# Patient Record
Sex: Female | Born: 1950 | Race: Asian | Hispanic: No | Marital: Married | Smoking: Current every day smoker
Health system: Southern US, Community
[De-identification: ages and names within clinical notes are randomized; demographics above are authoritative.]

## PROBLEM LIST (undated history)

## (undated) DIAGNOSIS — I10 Essential (primary) hypertension: Secondary | ICD-10-CM

## (undated) DIAGNOSIS — K760 Fatty (change of) liver, not elsewhere classified: Secondary | ICD-10-CM

## (undated) DIAGNOSIS — T8859XA Other complications of anesthesia, initial encounter: Secondary | ICD-10-CM

## (undated) DIAGNOSIS — E119 Type 2 diabetes mellitus without complications: Secondary | ICD-10-CM

## (undated) DIAGNOSIS — M1990 Unspecified osteoarthritis, unspecified site: Secondary | ICD-10-CM

## (undated) DIAGNOSIS — T4145XA Adverse effect of unspecified anesthetic, initial encounter: Secondary | ICD-10-CM

## (undated) DIAGNOSIS — K759 Inflammatory liver disease, unspecified: Secondary | ICD-10-CM

## (undated) HISTORY — PX: FRACTURE SURGERY: SHX138

## (undated) HISTORY — PX: BACK SURGERY: SHX140

---
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
## 1973-08-30 HISTORY — PX: SPINAL FUSION: SHX223

## 1988-08-30 DIAGNOSIS — K759 Inflammatory liver disease, unspecified: Secondary | ICD-10-CM

## 1988-08-30 HISTORY — DX: Inflammatory liver disease, unspecified: K75.9

## 1990-08-30 HISTORY — PX: SPINAL FUSION: SHX223

## 1991-08-31 HISTORY — PX: ABDOMINAL HYSTERECTOMY: SHX81

## 2008-08-30 HISTORY — PX: ORIF TIBIA & FIBULA FRACTURES: SHX2131

## 2011-05-14 ENCOUNTER — Telehealth: Payer: Self-pay | Admitting: Family

## 2011-05-14 NOTE — Telephone Encounter (Signed)
Opened in error

## 2014-02-15 DIAGNOSIS — R740 Nonspecific elevation of levels of transaminase and lactic acid dehydrogenase [LDH]: Secondary | ICD-10-CM

## 2014-02-15 DIAGNOSIS — IMO0002 Reserved for concepts with insufficient information to code with codable children: Secondary | ICD-10-CM | POA: Insufficient documentation

## 2014-02-15 DIAGNOSIS — IMO0001 Reserved for inherently not codable concepts without codable children: Secondary | ICD-10-CM | POA: Insufficient documentation

## 2014-02-15 DIAGNOSIS — L720 Epidermal cyst: Secondary | ICD-10-CM | POA: Insufficient documentation

## 2014-02-15 DIAGNOSIS — M25559 Pain in unspecified hip: Secondary | ICD-10-CM | POA: Insufficient documentation

## 2014-02-15 DIAGNOSIS — K219 Gastro-esophageal reflux disease without esophagitis: Secondary | ICD-10-CM | POA: Insufficient documentation

## 2014-02-15 DIAGNOSIS — L209 Atopic dermatitis, unspecified: Secondary | ICD-10-CM | POA: Insufficient documentation

## 2014-02-15 DIAGNOSIS — M7711 Lateral epicondylitis, right elbow: Secondary | ICD-10-CM | POA: Insufficient documentation

## 2014-02-15 DIAGNOSIS — R229 Localized swelling, mass and lump, unspecified: Secondary | ICD-10-CM | POA: Insufficient documentation

## 2014-02-15 DIAGNOSIS — B079 Viral wart, unspecified: Secondary | ICD-10-CM | POA: Insufficient documentation

## 2014-02-15 DIAGNOSIS — R12 Heartburn: Secondary | ICD-10-CM | POA: Insufficient documentation

## 2014-02-15 DIAGNOSIS — I10 Essential (primary) hypertension: Secondary | ICD-10-CM | POA: Insufficient documentation

## 2014-02-15 DIAGNOSIS — D72829 Elevated white blood cell count, unspecified: Secondary | ICD-10-CM | POA: Insufficient documentation

## 2014-02-15 DIAGNOSIS — E785 Hyperlipidemia, unspecified: Secondary | ICD-10-CM | POA: Insufficient documentation

## 2014-02-15 DIAGNOSIS — M8580 Other specified disorders of bone density and structure, unspecified site: Secondary | ICD-10-CM | POA: Insufficient documentation

## 2014-02-15 DIAGNOSIS — Z9889 Other specified postprocedural states: Secondary | ICD-10-CM | POA: Insufficient documentation

## 2014-02-15 DIAGNOSIS — G43909 Migraine, unspecified, not intractable, without status migrainosus: Secondary | ICD-10-CM | POA: Insufficient documentation

## 2014-02-15 DIAGNOSIS — B182 Chronic viral hepatitis C: Secondary | ICD-10-CM | POA: Insufficient documentation

## 2014-02-15 DIAGNOSIS — E1165 Type 2 diabetes mellitus with hyperglycemia: Secondary | ICD-10-CM

## 2014-08-05 DIAGNOSIS — D123 Benign neoplasm of transverse colon: Secondary | ICD-10-CM

## 2015-06-11 DIAGNOSIS — R6882 Decreased libido: Secondary | ICD-10-CM | POA: Insufficient documentation

## 2016-02-10 DIAGNOSIS — M79674 Pain in right toe(s): Secondary | ICD-10-CM | POA: Insufficient documentation

## 2016-02-20 DIAGNOSIS — K810 Acute cholecystitis: Secondary | ICD-10-CM | POA: Insufficient documentation

## 2016-02-22 DIAGNOSIS — E876 Hypokalemia: Secondary | ICD-10-CM | POA: Insufficient documentation

## 2016-07-27 DIAGNOSIS — L659 Nonscarring hair loss, unspecified: Secondary | ICD-10-CM | POA: Insufficient documentation

## 2016-08-30 HISTORY — PX: CHOLECYSTECTOMY: SHX55

## 2017-05-31 DIAGNOSIS — M79671 Pain in right foot: Secondary | ICD-10-CM | POA: Insufficient documentation

## 2017-05-31 DIAGNOSIS — M79672 Pain in left foot: Secondary | ICD-10-CM

## 2017-09-12 DIAGNOSIS — M79641 Pain in right hand: Secondary | ICD-10-CM | POA: Insufficient documentation

## 2017-09-12 DIAGNOSIS — M79642 Pain in left hand: Secondary | ICD-10-CM

## 2017-10-04 DIAGNOSIS — M47816 Spondylosis without myelopathy or radiculopathy, lumbar region: Secondary | ICD-10-CM | POA: Insufficient documentation

## 2017-10-04 DIAGNOSIS — M5136 Other intervertebral disc degeneration, lumbar region: Secondary | ICD-10-CM | POA: Insufficient documentation

## 2017-12-29 DIAGNOSIS — S3210XA Unspecified fracture of sacrum, initial encounter for closed fracture: Secondary | ICD-10-CM | POA: Insufficient documentation

## 2018-01-17 ENCOUNTER — Other Ambulatory Visit: Payer: Self-pay | Admitting: Neurosurgery

## 2018-01-17 DIAGNOSIS — S32131A Minimally displaced Zone III fracture of sacrum, initial encounter for closed fracture: Secondary | ICD-10-CM

## 2018-01-27 ENCOUNTER — Inpatient Hospital Stay
Admission: RE | Admit: 2018-01-27 | Discharge: 2018-01-27 | Disposition: A | Discharge status: Home / Self Care | Payer: Self-pay | Source: Ambulatory Visit | Attending: Neurosurgery | Admitting: Neurosurgery

## 2018-02-03 ENCOUNTER — Ambulatory Visit
Admission: RE | Admit: 2018-02-03 | Discharge: 2018-02-03 | Disposition: A | Discharge status: Home / Self Care | Payer: Medicare Other | Source: Ambulatory Visit | Attending: Neurosurgery | Admitting: Neurosurgery

## 2018-02-03 DIAGNOSIS — S32131A Minimally displaced Zone III fracture of sacrum, initial encounter for closed fracture: Secondary | ICD-10-CM

## 2018-02-03 NOTE — Consult Note (Signed)
Chief Complaint: Patient was seen in consultation today for bilateral sacral insufficiency fractures at the request of Wall,Angel  Referring Physician(s): Wall,Angel    History of Present Illness: Angel Wall is a 67 y.o. female who sustained a fall 2 months ago 12/07/17.  She was seen by Dr. Saintclair Wall and diagnosed with bilateral sacral fractures.  This was confirmed with MRI 5/17 which demonstrated non healed bilateral sacral insufficiency fractures.  She has had continuous pain since the fall.  She currently rates her pain 8/10 on a visual analog scale while taking celebrex and neurontin.  She has been walking with a cane but with pain.  She has not had any additional falls.  The pain is in her posterior pelvis but has radicular components, worse on the right.    Her pain is greatest when getting out of bed in the morning.  She is limited in activities of daily living in that she requires much more time to care for her house.  She is unable to lift anything over five pounds.  She has extreme pain getting up from a low position.  She is unable to vacuum or empty the dishwasher without significant pain.  She scored 12/24 on the [employer] disability Questionnaire.  No past medical history on file.    Allergies: Aspirin   Medications: Prior to Admission medications   Metformin 500 mg twice daily Atenolol/chlorthalidone 25mg  once daily Celebrex 200 mg once daily Proglitazone 30 mg once daily Simvistatin 20 mig once daily Pantoprazole 40 mg once daily. Gabapentin 600 mg three times daily Cyclobenzapine 10 mg as needed     No family history on file.  Social History   Socioeconomic History  . Marital status: Married    Spouse name: Not on file  . Number of children: Not on file  . Years of education: Not on file  . Highest education level: Not on file  Occupational History  . Not on file  Social Needs  . Financial resource strain: Not on file  . Food insecurity:      Worry: Not on file    Inability: Not on file  . Transportation needs:    Medical: Not on file    Non-medical: Not on file  Tobacco Use  . Smoking status: Not on file  Substance and Sexual Activity  . Alcohol use: Not on file  . Drug use: Not on file  . Sexual activity: Not on file  Lifestyle  . Physical activity:    Days per week: Not on file    Minutes per session: Not on file  . Stress: Not on file  Relationships  . Social connections:    Talks on phone: Not on file    Gets together: Not on file    Attends religious service: Not on file    Active member of club or organization: Not on file    Attends meetings of clubs or organizations: Not on file    Relationship status: Not on file  Other Topics Concern  . Not on file  Social History Narrative  . Not on file     Review of Systems: A 12 point ROS discussed and pertinent positives are indicated in the HPI above.  All other systems are negative.  Review of Systems  Constitutional: Negative.   HENT: Negative.   Gastrointestinal: Positive for nausea.  Neurological: Positive for light-headedness.  All other systems reviewed and are negative.   Vital Signs: BP 123/75   Pulse 65  Temp 97.6 F (36.4 C)   Resp 16   Physical Exam  Constitutional: She appears well-developed and well-nourished.  Musculoskeletal:       Right shoulder: She exhibits tenderness and bony tenderness.       Lumbar back: She exhibits bony tenderness.       Back:  Pain with palpation over posterior sacrum, right greater than left.  Neurological: She has normal strength. A sensory deficit is present.  Decreased sensation to light touch lateral left calf.    Imaging: MRI Pelvis 01/13/18 - Bilateral sacral ala fractures. Labs:  CBC: 01/19/18:  7.9,/4.77/ 15.4/45.9/280  COAGS: No results for input(s): INR, APTT in the last 8760 hours.  BMP: No results for input(s): NA, K, CL, CO2, GLUCOSE, BUN, CALCIUM, CREATININE, GFRNONAA, GFRAA  in the last 8760 hours.  Invalid input(s): CMP  LIVER FUNCTION TESTS: No results for input(s): BILITOT, AST, ALT, ALKPHOS, PROT, ALBUMIN in the last 8760 hours. 2 TUMOR MARKERS: No results for input(s): AFPTM, CEA, CA199, CHROMGRNA in the last 8760 hours.  Assessment and Plan:  Bilateral sacral insufficiency fractures, more extensive right than left.  The fractures are not healed after 2 months and continue to have significant impact on her activities of daily living putting her at risk for secondary complications.  She Is walking with a cane and limited in her ability to take care of her house.  Given the delayed healing, I have recommended bilateral sacroplasty to facilitate her healing.  We discussed the risks and benefits including but not limited to infection, bleeding, cement extrusion from the sacrum, nerve injury, and failure to alleviate her pain.  We discussed the likelihood of reducing the time to complete healing of these fractures based on the vertebroplasty and sacroplasty experience as being high in the setting of otherwise delayed healing.  She is interested in proceeding and has been scheduled for 02/17/18.  Thank you for this interesting consult.  I greatly enjoyed meeting Angel Wall and look forward to participating in their care.  A copy of this report was sent to the requesting provider on this date.  Electronically Signed: Arna Snipe, MD 02/03/2018, 4:18 PM   I spent a total of  15 Minutes   in face to face in clinical consultation, greater than 50% of which was counseling/coordinating care for Angel Wall.

## 2018-02-17 ENCOUNTER — Telehealth: Payer: Self-pay

## 2018-02-17 ENCOUNTER — Ambulatory Visit
Admission: RE | Admit: 2018-02-17 | Discharge: 2018-02-17 | Disposition: A | Discharge status: Home / Self Care | Payer: Medicare Other | Source: Ambulatory Visit | Attending: Neurosurgery | Admitting: Neurosurgery

## 2018-02-17 DIAGNOSIS — S32131A Minimally displaced Zone III fracture of sacrum, initial encounter for closed fracture: Secondary | ICD-10-CM

## 2018-02-17 MED ORDER — CEFAZOLIN SODIUM-DEXTROSE 2-4 GM/100ML-% IV SOLN
2.0000 g | INTRAVENOUS | Status: AC
  Administered 2018-02-17: 2 g via INTRAVENOUS

## 2018-02-17 MED ORDER — KETOROLAC TROMETHAMINE 30 MG/ML IJ SOLN: 30.0000 mg | Freq: Once | INTRAMUSCULAR | Status: DC

## 2018-02-17 MED ORDER — FENTANYL CITRATE (PF) 100 MCG/2ML IJ SOLN: 25.0000 ug | INTRAMUSCULAR | Status: DC | PRN

## 2018-02-17 MED ORDER — MIDAZOLAM HCL 2 MG/2ML IJ SOLN: 1.0000 mg | INTRAMUSCULAR | Status: DC | PRN

## 2018-02-17 MED ORDER — SODIUM CHLORIDE 0.9 % IV SOLN
Freq: Once | INTRAVENOUS | Status: AC
  Administered 2018-02-17: 08:00:00 via INTRAVENOUS

## 2018-02-17 NOTE — Discharge Instructions (Signed)
Sacroplasty Post Procedure Discharge Instructions ° °1. May resume a regular diet and any medications that you routinely take (including pain medications). °2. No driving day of procedure. °3. Upon discharge go home and rest for at least 4 hours.  May use an ice pack as needed to injection sites on back.  Ice to back 30 minutes on and 30 minutes off, all day. °4. May remove bandaids tomorrow after taking a shower. Replace daily with clean bandaid until healed. °5. Do not lift anything heavier than a milk jug. °6. Follow up with your attending physician in 2 weeks. ° ° ° °Please contact our office at [phone] for the following symptoms: ° °· Fever greater than 100 degrees °· Increased swelling, pain, or redness at injection site. ° ° °Thank you for visiting [facility]. °

## 2018-02-22 ENCOUNTER — Other Ambulatory Visit (HOSPITAL_COMMUNITY): Payer: Self-pay | Admitting: Radiology

## 2018-02-22 ENCOUNTER — Telehealth: Payer: Self-pay

## 2018-02-22 DIAGNOSIS — Z09 Encounter for follow-up examination after completed treatment for conditions other than malignant neoplasm: Secondary | ICD-10-CM

## 2018-02-22 NOTE — Telephone Encounter (Signed)
Called patient to see how she is doing after sacroplasty here 02/17/18 and to schedule F/U appt with Dr. Jobe Igo.  She states she feels better but still has some discomfort sitting.  She understands the fractures still are healing and that this is to be expected.  Brita Romp, RN

## 2018-03-13 ENCOUNTER — Ambulatory Visit
Admission: RE | Admit: 2018-03-13 | Discharge: 2018-03-13 | Disposition: A | Discharge status: Home / Self Care | Payer: Medicare Other | Source: Ambulatory Visit | Attending: Radiology | Admitting: Radiology

## 2018-03-13 DIAGNOSIS — Z09 Encounter for follow-up examination after completed treatment for conditions other than malignant neoplasm: Secondary | ICD-10-CM

## 2018-03-13 NOTE — Progress Notes (Signed)
Referring Physician(s): Kary Kos  Chief Complaint: The patient is seen in follow up today s/p bilateral sacroplasty 02/17/18.   History of present illness:  Bilateral sacral insufficiency fractures with severe debilitating pain.  Following bilateral sacroplasty she has been more mobile and able to perform activities of daily living independently.  She reports being pain free for at least 3 days.    Subsequently she has developed a left sided radicular pain.  She now scores 6/24 on the [employer] disability questionnaire.  She reports 7/10 pain on a visual analog scale, but relates this to her radicular pain.  No past medical history on file.  Allergies: Aspirin  Medications: Prior to Admission medications   Medication Sig Start Date End Date Taking? Authorizing Provider  atenolol-chlorthalidone (TENORETIC) 100-25 MG tablet Take by mouth. 12/30/16   [provider]  celecoxib (CELEBREX) 200 MG capsule Take by mouth. 06/20/17 06/20/18  [provider]  cyclobenzaprine (FLEXERIL) 10 MG tablet Take by mouth. 10/27/17   [provider]  gabapentin (NEURONTIN) 600 MG tablet Take by mouth. 02/14/18 05/15/18  [provider]  Lancets MISC Check blood glucose twice daily / DX: E11.65 11/28/14   [provider]  metFORMIN (GLUCOPHAGE-XR) 500 MG 24 hr tablet Take 2 tablets 2 times daily 12/31/16   [provider]  pantoprazole (PROTONIX) 40 MG tablet Take by mouth. 02/14/18   [provider]  pioglitazone (ACTOS) 30 MG tablet Take by mouth. 12/31/16 09/12/18  [provider]  simvastatin (ZOCOR) 10 MG tablet Take by mouth. 12/31/16   [provider]     No family history on file.  Social History   Socioeconomic History  . Marital status: Married    Spouse name: Not on file  . Number of children: Not on file  . Years of education: Not on file  . Highest education level: Not on file  Occupational History  .  Not on file  Social Needs  . Financial resource strain: Not on file  . Food insecurity:    Worry: Not on file    Inability: Not on file  . Transportation needs:    Medical: Not on file    Non-medical: Not on file  Tobacco Use  . Smoking status: Not on file  Substance and Sexual Activity  . Alcohol use: Not on file  . Drug use: Not on file  . Sexual activity: Not on file  Lifestyle  . Physical activity:    Days per week: Not on file    Minutes per session: Not on file  . Stress: Not on file  Relationships  . Social connections:    Talks on phone: Not on file    Gets together: Not on file    Attends religious service: Not on file    Active member of club or organization: Not on file    Attends meetings of clubs or organizations: Not on file    Relationship status: Not on file  Other Topics Concern  . Not on file  Social History Narrative  . Not on file     Vital Signs: BP 121/85 (BP Location: Right Arm, Patient Position: Sitting, Cuff Size: Normal)   Pulse 70   Temp 97.7 F (36.5 C)   Resp 14   SpO2 97%   Physical Exam  Constitutional: She appears well-developed and well-nourished.  Musculoskeletal:  Skin incisions are well healed.  She has some pain to palpation over the left sacral ala, but none  on the right.     Assessment and Plan:  1.  Healed bilateral sacral insufficiency fractures following sacroplasty. 2.  Workup for osteoporosis recommended. 3.  New left sided radicular pain.  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  She may benefit from epidural steroid injection.  She is currently using Celebrex and Tramadol.  She does not wish to use a steroid dose pack due to her diabetes.    Electronically Signed: Doy Taaffe W 03/13/2018, 3:28 PM   I spent a total of 15 Minutes in face to face in clinical consultation, greater than 50% of which was counseling/coordinating care for Mrs. Angel Wall

## 2019-02-02 ENCOUNTER — Other Ambulatory Visit: Payer: Self-pay | Admitting: Student

## 2019-02-02 ENCOUNTER — Telehealth: Payer: Self-pay | Admitting: Nurse Practitioner

## 2019-02-02 DIAGNOSIS — M5440 Lumbago with sciatica, unspecified side: Secondary | ICD-10-CM

## 2019-02-02 NOTE — Telephone Encounter (Signed)
Phone call to patient to verify medication list and allergies for myelogram procedure. Pt aware she will not need to hold any medications for this procedure. Pre and post procedure instructions reviewed with pt. Pt verbalized understanding. 

## 2019-03-05 ENCOUNTER — Ambulatory Visit
Admission: RE | Admit: 2019-03-05 | Discharge: 2019-03-05 | Disposition: A | Discharge status: Home / Self Care | Payer: Medicare Other | Source: Ambulatory Visit | Attending: Student | Admitting: Student

## 2019-03-05 VITALS — BP 121/70 | HR 74

## 2019-03-05 DIAGNOSIS — M5440 Lumbago with sciatica, unspecified side: Secondary | ICD-10-CM

## 2019-03-05 DIAGNOSIS — M5136 Other intervertebral disc degeneration, lumbar region: Secondary | ICD-10-CM

## 2019-03-05 DIAGNOSIS — M47816 Spondylosis without myelopathy or radiculopathy, lumbar region: Secondary | ICD-10-CM

## 2019-03-05 MED ORDER — DIAZEPAM 5 MG PO TABS
5.0000 mg | ORAL_TABLET | Freq: Once | ORAL | Status: AC
  Administered 2019-03-05: 5 mg via ORAL

## 2019-03-05 MED ORDER — IOPAMIDOL (ISOVUE-M 200) INJECTION 41%
18.0000 mL | Freq: Once | INTRAMUSCULAR | Status: AC
  Administered 2019-03-05: 18 mL via INTRATHECAL

## 2019-03-05 NOTE — Discharge Instructions (Signed)

## 2019-05-24 ENCOUNTER — Other Ambulatory Visit: Payer: Self-pay | Admitting: Neurosurgery

## 2019-06-08 NOTE — Progress Notes (Signed)
[address] [address] [place] [address] Phone: [phone] Fax: [phone]      Your procedure is scheduled on June 13, 2019.  Report to [facility] Main Entrance "A" at 8:45 A.M., and check in at the Admitting office.  Call this number if you have problems the morning of surgery:  [phone]  Call [phone] if you have any questions prior to your surgery date Monday-Friday 8am-4pm    Remember:  Do not eat or drink after midnight the night before your surgery     Take these medicines the morning of surgery with A SIP OF WATER:  celecoxib (CELEBREX)  pantoprazole (PROTONIX)  cyclobenzaprine (FLEXERIL) - if needed gabapentin (NEURONTIN) - if needed   As of today,  STOP taking any Aspirin (unless otherwise instructed by your surgeon), Aleve, Naproxen, Ibuprofen, Motrin, Advil, Goody's, BC's, all herbal medications, fish oil, and all vitamins.   WHAT DO I DO ABOUT MY DIABETES MEDICATION?   Marland Kitchen Do not take oral diabetes medicines (pills) the morning of surgery.   How to Manage Your Diabetes Before and After Surgery  Why is it important to control my blood sugar before and after surgery? . Improving blood sugar levels before and after surgery helps healing and can limit problems. . A way of improving blood sugar control is eating a healthy diet by: o  Eating less sugar and carbohydrates o  Increasing activity/exercise o  Talking with your doctor about reaching your blood sugar goals . High blood sugars (greater than 180 mg/dL) can raise your risk of infections and slow your recovery, so you will need to focus on controlling your diabetes during the weeks before surgery. . Make sure that the doctor who takes care of your diabetes knows about your planned surgery including the date and location.  How do I manage my blood sugar before surgery? . Check your blood sugar at least 4 times a day,  starting 2 days before surgery, to make sure that the level is not too high or low. o Check your blood sugar the morning of your surgery when you wake up and every 2 hours until you get to the Short Stay unit. . If your blood sugar is less than 70 mg/dL, you will need to treat for low blood sugar: o Do not take insulin. o Treat a low blood sugar (less than 70 mg/dL) with  cup of clear juice (cranberry or apple), 4 glucose tablets, OR glucose gel. o Recheck blood sugar in 15 minutes after treatment (to make sure it is greater than 70 mg/dL). If your blood sugar is not greater than 70 mg/dL on recheck, call [phone] for further instructions. . Report your blood sugar to the short stay nurse when you get to Short Stay.  . If you are admitted to the hospital after surgery: o Your blood sugar will be checked by the staff and you will probably be given insulin after surgery (instead of oral diabetes medicines) to make sure you have good blood sugar levels. o The goal for blood sugar control after surgery is 80-180 mg/dL.    The Morning of Surgery  Do not wear jewelry, make-up or nail polish.  Do not wear lotions, powders, or perfumes, or deodorant  Do not shave 48 hours prior to surgery.    Do not bring valuables to the hospital.  [facility] is not responsible for any belongings or valuables.  If you are a smoker, DO NOT Smoke 24 hours prior to surgery IF you wear a CPAP at night please bring your mask, tubing, and machine the morning of surgery   Remember that you must have someone to transport you home after your surgery, and remain with you for 24 hours if you are discharged the same day.   Contacts, glasses, hearing aids, dentures or bridgework may not be worn into surgery.    Leave your suitcase in the car.  After surgery it may be brought to your room.  For patients admitted to the hospital, discharge time will be determined by your treatment team.  Patients discharged the day  of surgery will not be allowed to drive home.    Special instructions:   [place]- Preparing For Surgery  Before surgery, you can play an important role. Because skin is not sterile, your skin needs to be as free of germs as possible. You can reduce the number of germs on your skin by washing with CHG (chlorahexidine gluconate) Soap before surgery.  CHG is an antiseptic cleaner which kills germs and bonds with the skin to continue killing germs even after washing.    Oral Hygiene is also important to reduce your risk of infection.  Remember - BRUSH YOUR TEETH THE MORNING OF SURGERY WITH YOUR REGULAR TOOTHPASTE  Please do not use if you have an allergy to CHG or antibacterial soaps. If your skin becomes reddened/irritated stop using the CHG.  Do not shave (including legs and underarms) for at least 48 hours prior to first CHG shower. It is OK to shave your face.  Please follow these instructions carefully.   1. Shower the NIGHT BEFORE SURGERY and the MORNING OF SURGERY with CHG Soap.   2. If you chose to wash your hair, wash your hair first as usual with your normal shampoo.  3. After you shampoo, rinse your hair and body thoroughly to remove the shampoo.  4. Use CHG as you would any other liquid soap. You can apply CHG directly to the skin and wash gently with a scrungie or a clean washcloth.   5. Apply the CHG Soap to your body ONLY FROM THE NECK DOWN.  Do not use on open wounds or open sores. Avoid contact with your eyes, ears, mouth and genitals (private parts). Wash Face and genitals (private parts)  with your normal soap.   6. Wash thoroughly, paying special attention to the area where your surgery will be performed.  7. Thoroughly rinse your body with warm water from the neck down.  8. DO NOT shower/wash with your normal soap after using and rinsing off the CHG Soap.  9. Pat yourself dry with a CLEAN TOWEL.  10. Wear CLEAN PAJAMAS to bed the night before surgery, wear  comfortable clothes the morning of surgery  11. Place CLEAN SHEETS on your bed the night of your first shower and DO NOT SLEEP WITH PETS.    Day of Surgery:  Do not apply any deodorants/lotions. Please shower the morning of surgery with the CHG soap  Please wear clean clothes to the hospital/surgery center.   Remember to brush your teeth WITH YOUR REGULAR TOOTHPASTE.   Please read over the following fact sheets that you were given.

## 2019-06-11 ENCOUNTER — Other Ambulatory Visit: Payer: Self-pay

## 2019-06-11 ENCOUNTER — Other Ambulatory Visit (HOSPITAL_COMMUNITY)
Admission: RE | Admit: 2019-06-11 | Discharge: 2019-06-11 | Disposition: A | Discharge status: Home / Self Care | Payer: Medicare Other | Source: Ambulatory Visit | Attending: Neurosurgery | Admitting: Neurosurgery

## 2019-06-11 ENCOUNTER — Encounter (HOSPITAL_COMMUNITY): Payer: Self-pay

## 2019-06-11 ENCOUNTER — Encounter (HOSPITAL_COMMUNITY)
Admission: RE | Admit: 2019-06-11 | Discharge: 2019-06-11 | Disposition: A | Discharge status: Home / Self Care | Payer: Medicare Other | Source: Ambulatory Visit | Attending: Neurosurgery | Admitting: Neurosurgery

## 2019-06-11 DIAGNOSIS — Z20828 Contact with and (suspected) exposure to other viral communicable diseases: Secondary | ICD-10-CM | POA: Insufficient documentation

## 2019-06-11 DIAGNOSIS — Z01812 Encounter for preprocedural laboratory examination: Secondary | ICD-10-CM | POA: Insufficient documentation

## 2019-06-11 HISTORY — DX: Essential (primary) hypertension: I10

## 2019-06-11 HISTORY — DX: Unspecified osteoarthritis, unspecified site: M19.90

## 2019-06-11 HISTORY — DX: Other complications of anesthesia, initial encounter: T88.59XA

## 2019-06-11 HISTORY — DX: Type 2 diabetes mellitus without complications: E11.9

## 2019-06-11 HISTORY — DX: Fatty (change of) liver, not elsewhere classified: K76.0

## 2019-06-11 LAB — SURGICAL PCR SCREEN
MRSA, PCR: NEGATIVE
Staphylococcus aureus: NEGATIVE

## 2019-06-11 LAB — COMPREHENSIVE METABOLIC PANEL
ALT: 29 U/L (ref 0–44)
AST: 29 U/L (ref 15–41)
Albumin: 4.1 g/dL (ref 3.5–5.0)
Alkaline Phosphatase: 77 U/L (ref 38–126)
Anion gap: 13 (ref 5–15)
BUN: 12 mg/dL (ref 8–23)
CO2: 27 mmol/L (ref 22–32)
Calcium: 9.2 mg/dL (ref 8.9–10.3)
Chloride: 98 mmol/L (ref 98–111)
Creatinine, Ser: 0.95 mg/dL (ref 0.44–1.00)
GFR calc Af Amer: >60 mL/min (ref >60)
GFR calc non Af Amer: >60 mL/min (ref >60)
Glucose, Bld: 140 mg/dL — ABNORMAL HIGH (ref 70–99)
Potassium: 3.8 mmol/L (ref 3.5–5.1)
Sodium: 138 mmol/L (ref 135–145)
Total Bilirubin: 0.5 mg/dL (ref 0.3–1.2)
Total Protein: 7.3 g/dL (ref 6.5–8.1)

## 2019-06-11 LAB — GLUCOSE, CAPILLARY: Glucose-Capillary: 154 mg/dL — ABNORMAL HIGH (ref 70–99)

## 2019-06-11 LAB — CBC
HCT: 47.2 % — ABNORMAL HIGH (ref 36.0–46.0)
Hemoglobin: 16.1 g/dL — ABNORMAL HIGH (ref 12.0–15.0)
MCH: 33.7 pg (ref 26.0–34.0)
MCHC: 34.1 g/dL (ref 30.0–36.0)
MCV: 98.7 fL (ref 80.0–100.0)
Platelets: 228 10*3/uL (ref 150–400)
RBC: 4.78 MIL/uL (ref 3.87–5.11)
RDW: 13.3 % (ref 11.5–15.5)
WBC: 9.9 10*3/uL (ref 4.0–10.5)
nRBC: 0 % (ref 0.0–0.2)

## 2019-06-11 LAB — SARS CORONAVIRUS 2 (TAT 6-24 HRS): SARS Coronavirus 2: NEGATIVE

## 2019-06-11 LAB — HEMOGLOBIN A1C
Hgb A1c MFr Bld: 6.8 % — ABNORMAL HIGH (ref 4.8–5.6)
Mean Plasma Glucose: 148.46 mg/dL

## 2019-06-11 NOTE — Progress Notes (Signed)
[address] [address] [place] [address] Phone: [phone] Fax: [phone]      Your procedure is scheduled on Wednesday, October 14th .  Report to [facility] Main Entrance "A" at 8:45 A.M., and check in at the Admitting office.   Call this number if you have problems the morning of surgery:  [phone]  Call [phone] if you have any questions prior to your surgery date Monday-Friday 8am-4pm    Remember:  Do not eat or drink after midnight the night before your surgery    Take these medicines the morning of surgery with A SIP OF WATER: Atenolol-chlorthalidone (Tenoretic) Cyclobenzaprine (Flexeril) - if needed Gabapentin (Neudrontin) - if needed Hydroxypropyl methyl-cellulose (Isopto Tears) - if needed Pantoprozole (Protonix)  7 days prior to surgery STOP taking any Aspirin (unless otherwise instructed by your surgeon), Aleve, Naproxen, Ibuprofen, Motrin, Advil, Goody's, BC's, all herbal medications, fish oil, and all vitamins.    The Morning of Surgery  Do not wear jewelry, make-up or nail polish.  Do not wear lotions, powders, or perfumes/colognes, or deodorant  Do not shave 48 hours prior to surgery.  Men may shave face and neck.  Do not bring valuables to the hospital.  [facility] is not responsible for any belongings or valuables.  If you are a smoker, DO NOT Smoke 24 hours prior to surgery IF you wear a CPAP at night please bring your mask, tubing, and machine the morning of surgery   Remember that you must have someone to transport you home after your surgery, and remain with you for 24 hours if you are discharged the same day.   Contacts, glasses, hearing aids, dentures or bridgework may not be worn into surgery.    Leave your suitcase in the car.  After surgery it may be brought to your room.  For patients admitted to the hospital, discharge time will be determined by your treatment  team.  Patients discharged the day of surgery will not be allowed to drive home.    Special instructions:   [place]- Preparing For Surgery  Before surgery, you can play an important role. Because skin is not sterile, your skin needs to be as free of germs as possible. You can reduce the number of germs on your skin by washing with CHG (chlorahexidine gluconate) Soap before surgery.  CHG is an antiseptic cleaner which kills germs and bonds with the skin to continue killing germs even after washing.    Oral Hygiene is also important to reduce your risk of infection.  Remember - BRUSH YOUR TEETH THE MORNING OF SURGERY WITH YOUR REGULAR TOOTHPASTE  Please do not use if you have an allergy to CHG or antibacterial soaps. If your skin becomes reddened/irritated stop using the CHG.  Do not shave (including legs and underarms) for at least 48 hours prior to first CHG shower. It is OK to shave your face.  Please follow these instructions carefully.   1. Shower the NIGHT BEFORE SURGERY and the MORNING OF SURGERY with CHG Soap.   2. If you chose to wash your hair, wash your hair first as usual with your normal shampoo.  3. After you shampoo, rinse your hair and body thoroughly to remove the shampoo.  4. Use CHG as you would any other liquid soap. You can apply CHG directly to the skin and wash gently with a scrungie or a clean washcloth.  5. Apply the CHG Soap to your body ONLY FROM THE NECK DOWN.  Do not use on open wounds or open sores. Avoid contact with your eyes, ears, mouth and genitals (private parts). Wash Face and genitals (private parts)  with your normal soap.   6. Wash thoroughly, paying special attention to the area where your surgery will be performed.  7. Thoroughly rinse your body with warm water from the neck down.  8. DO NOT shower/wash with your normal soap after using and rinsing off the CHG Soap.  9. Pat yourself dry with a CLEAN TOWEL.  10. Wear CLEAN PAJAMAS to bed  the night before surgery, wear comfortable clothes the morning of surgery  11. Place CLEAN SHEETS on your bed the night of your first shower and DO NOT SLEEP WITH PETS.    Day of Surgery:  Do not apply any deodorants/lotions. Please shower the morning of surgery with the CHG soap  Please wear clean clothes to the hospital/surgery center.   Remember to brush your teeth WITH YOUR REGULAR TOOTHPASTE.   Please read over the following fact sheets that you were given.

## 2019-06-11 NOTE — Progress Notes (Signed)
PCP - Dr. Rolan Lipa - [place] Cardiologist - denies  PPM/ICD - N/A Device Orders -  Rep Notified  Chest x-ray - N/A EKG - needs DOS Stress Test - denies ECHO - denies Cardiac Cath - denies  Sleep Study - denies CPAP -   Fasting Blood Sugar - 110s Checks Blood Sugar ____1_ times a day  Blood Thinner Instructions: N/A Aspirin Instructions:N/A  ERAS Protocol - N/A PRE-SURGERY Ensure -   COVID TEST- scheduled following PAT today  Coronavirus Screening  Have you experienced the following symptoms:  Cough yes/no: No Fever (>100.74F)  yes/no: No Runny nose yes/no: No Sore throat yes/no: No Difficulty breathing/shortness of breath: No  Have you or a family member traveled in the last 14 days and where? yes/no: No   If the patient indicates "YES" to the above questions, their PAT will be rescheduled to limit the exposure to others and, the surgeon will be notified. THE PATIENT WILL NEED TO BE ASYMPTOMATIC FOR 14 DAYS.   If the patient is not experiencing any of these symptoms, the PAT nurse will instruct them to NOT bring anyone with them to their appointment since they may have these symptoms or traveled as well.   Please remind your patients and families that hospital visitation restrictions are in effect and the importance of the restrictions.    Anesthesia review: N/A  Patient denies shortness of breath, fever, cough and chest pain at PAT appointment   Patient verbalized understanding of instructions that were given to them at the PAT appointment. Patient was also instructed that they will need to review over the PAT instructions again at home before surgery.

## 2019-06-13 ENCOUNTER — Encounter (HOSPITAL_COMMUNITY)
Admission: RE | Disposition: A | Discharge status: Home / Self Care | Payer: Self-pay | Source: Home / Self Care | Attending: Neurosurgery

## 2019-06-13 ENCOUNTER — Inpatient Hospital Stay (HOSPITAL_COMMUNITY): Payer: Medicare Other | Admitting: Anesthesiology

## 2019-06-13 ENCOUNTER — Encounter (HOSPITAL_COMMUNITY): Payer: Self-pay

## 2019-06-13 ENCOUNTER — Other Ambulatory Visit: Payer: Self-pay

## 2019-06-13 ENCOUNTER — Inpatient Hospital Stay (HOSPITAL_COMMUNITY)
Admission: RE | Admit: 2019-06-13 | Discharge: 2019-06-16 | DRG: 455 | Disposition: A | Discharge status: Home / Self Care | Payer: Medicare Other | Attending: Neurosurgery | Admitting: Neurosurgery

## 2019-06-13 ENCOUNTER — Inpatient Hospital Stay (HOSPITAL_COMMUNITY): Payer: Medicare Other

## 2019-06-13 DIAGNOSIS — Z7984 Long term (current) use of oral hypoglycemic drugs: Secondary | ICD-10-CM

## 2019-06-13 DIAGNOSIS — I10 Essential (primary) hypertension: Secondary | ICD-10-CM | POA: Diagnosis present

## 2019-06-13 DIAGNOSIS — F17210 Nicotine dependence, cigarettes, uncomplicated: Secondary | ICD-10-CM | POA: Diagnosis present

## 2019-06-13 DIAGNOSIS — M48062 Spinal stenosis, lumbar region with neurogenic claudication: Principal | ICD-10-CM | POA: Diagnosis present

## 2019-06-13 DIAGNOSIS — Z419 Encounter for procedure for purposes other than remedying health state, unspecified: Secondary | ICD-10-CM

## 2019-06-13 DIAGNOSIS — M4726 Other spondylosis with radiculopathy, lumbar region: Secondary | ICD-10-CM | POA: Diagnosis present

## 2019-06-13 DIAGNOSIS — Z79899 Other long term (current) drug therapy: Secondary | ICD-10-CM

## 2019-06-13 DIAGNOSIS — M5136 Other intervertebral disc degeneration, lumbar region: Secondary | ICD-10-CM | POA: Diagnosis present

## 2019-06-13 DIAGNOSIS — E119 Type 2 diabetes mellitus without complications: Secondary | ICD-10-CM | POA: Diagnosis present

## 2019-06-13 DIAGNOSIS — K760 Fatty (change of) liver, not elsewhere classified: Secondary | ICD-10-CM | POA: Diagnosis present

## 2019-06-13 DIAGNOSIS — Z20828 Contact with and (suspected) exposure to other viral communicable diseases: Secondary | ICD-10-CM | POA: Diagnosis present

## 2019-06-13 DIAGNOSIS — Z90710 Acquired absence of both cervix and uterus: Secondary | ICD-10-CM

## 2019-06-13 DIAGNOSIS — Z9049 Acquired absence of other specified parts of digestive tract: Secondary | ICD-10-CM

## 2019-06-13 DIAGNOSIS — Z981 Arthrodesis status: Secondary | ICD-10-CM

## 2019-06-13 DIAGNOSIS — Z7982 Long term (current) use of aspirin: Secondary | ICD-10-CM

## 2019-06-13 LAB — GLUCOSE, CAPILLARY
Glucose-Capillary: 134 mg/dL — ABNORMAL HIGH (ref 70–99)
Glucose-Capillary: 201 mg/dL — ABNORMAL HIGH (ref 70–99)
Glucose-Capillary: 202 mg/dL — ABNORMAL HIGH (ref 70–99)

## 2019-06-13 SURGERY — POSTERIOR LUMBAR FUSION 2 LEVEL
Anesthesia: General | Site: Spine Lumbar

## 2019-06-13 MED ORDER — ALBUMIN HUMAN 5 % IV SOLN
INTRAVENOUS | Status: DC | PRN
  Administered 2019-06-13 (×3): via INTRAVENOUS

## 2019-06-13 MED ORDER — LIDOCAINE-EPINEPHRINE 1 %-1:100000 IJ SOLN
INTRAMUSCULAR | Status: DC | PRN
  Administered 2019-06-13: 10 mL

## 2019-06-13 MED ORDER — PROPOFOL 500 MG/50ML IV EMUL
INTRAVENOUS | Status: DC | PRN
  Administered 2019-06-13: 30 ug/kg/min via INTRAVENOUS

## 2019-06-13 MED ORDER — SIMVASTATIN 5 MG PO TABS
10.0000 mg | ORAL_TABLET | Freq: Every evening | ORAL | Status: DC
  Administered 2019-06-13 – 2019-06-15 (×3): 10 mg via ORAL
  Filled 2019-06-13 (×4): qty 2

## 2019-06-13 MED ORDER — FENTANYL CITRATE (PF) 100 MCG/2ML IJ SOLN
INTRAMUSCULAR | Status: AC
  Administered 2019-06-13: 16:00:00 50 ug via INTRAVENOUS
  Filled 2019-06-13: qty 2

## 2019-06-13 MED ORDER — ROCURONIUM BROMIDE 10 MG/ML (PF) SYRINGE
PREFILLED_SYRINGE | INTRAVENOUS | Status: AC
  Filled 2019-06-13: qty 10

## 2019-06-13 MED ORDER — CHLORTHALIDONE 25 MG PO TABS
25.0000 mg | ORAL_TABLET | Freq: Every day | ORAL | Status: DC
  Administered 2019-06-14 – 2019-06-16 (×3): 25 mg via ORAL
  Filled 2019-06-13 (×3): qty 1

## 2019-06-13 MED ORDER — PROMETHAZINE HCL 25 MG/ML IJ SOLN: 6.2500 mg | INTRAMUSCULAR | Status: DC | PRN

## 2019-06-13 MED ORDER — HYDROCODONE-ACETAMINOPHEN 5-325 MG PO TABS: 1.0000 | ORAL_TABLET | ORAL | Status: DC | PRN

## 2019-06-13 MED ORDER — ONDANSETRON HCL 4 MG/2ML IJ SOLN: 4.0000 mg | Freq: Four times a day (QID) | INTRAMUSCULAR | Status: DC | PRN

## 2019-06-13 MED ORDER — CYCLOBENZAPRINE HCL 10 MG PO TABS
10.0000 mg | ORAL_TABLET | Freq: Three times a day (TID) | ORAL | Status: DC | PRN
  Administered 2019-06-14 – 2019-06-16 (×5): 10 mg via ORAL
  Filled 2019-06-13 (×5): qty 1

## 2019-06-13 MED ORDER — SUGAMMADEX SODIUM 200 MG/2ML IV SOLN
INTRAVENOUS | Status: DC | PRN
  Administered 2019-06-13: 200 mg via INTRAVENOUS

## 2019-06-13 MED ORDER — OXYCODONE HCL 5 MG PO TABS
10.0000 mg | ORAL_TABLET | ORAL | Status: DC | PRN
  Administered 2019-06-13 – 2019-06-16 (×19): 10 mg via ORAL
  Filled 2019-06-13 (×20): qty 2

## 2019-06-13 MED ORDER — LACTATED RINGERS IV SOLN
INTRAVENOUS | Status: DC
  Administered 2019-06-13: 09:00:00 via INTRAVENOUS

## 2019-06-13 MED ORDER — SODIUM CHLORIDE 0.9 % IV SOLN
INTRAVENOUS | Status: DC | PRN
  Administered 2019-06-13: 12:00:00

## 2019-06-13 MED ORDER — DOCUSATE SODIUM 100 MG PO CAPS
100.0000 mg | ORAL_CAPSULE | Freq: Two times a day (BID) | ORAL | Status: DC
  Administered 2019-06-13 – 2019-06-16 (×6): 100 mg via ORAL
  Filled 2019-06-13 (×6): qty 1

## 2019-06-13 MED ORDER — BISACODYL 5 MG PO TBEC: 5.0000 mg | DELAYED_RELEASE_TABLET | Freq: Every day | ORAL | Status: DC | PRN

## 2019-06-13 MED ORDER — DIPHENHYDRAMINE HCL 50 MG/ML IJ SOLN
INTRAMUSCULAR | Status: AC
  Filled 2019-06-13: qty 1

## 2019-06-13 MED ORDER — ONDANSETRON HCL 4 MG/2ML IJ SOLN
INTRAMUSCULAR | Status: AC
  Filled 2019-06-13: qty 4

## 2019-06-13 MED ORDER — 0.9 % SODIUM CHLORIDE (POUR BTL) OPTIME
TOPICAL | Status: DC | PRN
  Administered 2019-06-13: 1000 mL

## 2019-06-13 MED ORDER — DEXAMETHASONE SODIUM PHOSPHATE 10 MG/ML IJ SOLN
10.0000 mg | Freq: Once | INTRAMUSCULAR | Status: AC
  Administered 2019-06-13: 5 mg via INTRAVENOUS

## 2019-06-13 MED ORDER — HYDROMORPHONE HCL 1 MG/ML IJ SOLN: 1.0000 mg | INTRAMUSCULAR | Status: DC | PRN

## 2019-06-13 MED ORDER — SODIUM CHLORIDE 0.9% FLUSH: 3.0000 mL | INTRAVENOUS | Status: DC | PRN

## 2019-06-13 MED ORDER — ONDANSETRON HCL 4 MG/2ML IJ SOLN
INTRAMUSCULAR | Status: DC | PRN
  Administered 2019-06-13: 4 mg via INTRAVENOUS

## 2019-06-13 MED ORDER — LIDOCAINE 2% (20 MG/ML) 5 ML SYRINGE
INTRAMUSCULAR | Status: DC | PRN
  Administered 2019-06-13: 60 mg via INTRAVENOUS

## 2019-06-13 MED ORDER — METFORMIN HCL ER 500 MG PO TB24
1000.0000 mg | ORAL_TABLET | Freq: Two times a day (BID) | ORAL | Status: DC
  Administered 2019-06-13 – 2019-06-16 (×6): 1000 mg via ORAL
  Filled 2019-06-13 (×9): qty 2

## 2019-06-13 MED ORDER — PANTOPRAZOLE SODIUM 40 MG PO TBEC
40.0000 mg | DELAYED_RELEASE_TABLET | Freq: Every day | ORAL | Status: DC
  Administered 2019-06-13 – 2019-06-16 (×4): 40 mg via ORAL
  Filled 2019-06-13 (×4): qty 1

## 2019-06-13 MED ORDER — ONDANSETRON HCL 4 MG PO TABS: 4.0000 mg | ORAL_TABLET | Freq: Four times a day (QID) | ORAL | Status: DC | PRN

## 2019-06-13 MED ORDER — PHENYLEPHRINE 40 MCG/ML (10ML) SYRINGE FOR IV PUSH (FOR BLOOD PRESSURE SUPPORT)
PREFILLED_SYRINGE | INTRAVENOUS | Status: DC | PRN
  Administered 2019-06-13: 80 ug via INTRAVENOUS

## 2019-06-13 MED ORDER — LACTATED RINGERS IV SOLN
INTRAVENOUS | Status: DC | PRN
  Administered 2019-06-13: 11:00:00 via INTRAVENOUS

## 2019-06-13 MED ORDER — MIDAZOLAM HCL 2 MG/2ML IJ SOLN
INTRAMUSCULAR | Status: DC | PRN
  Administered 2019-06-13: 2 mg via INTRAVENOUS

## 2019-06-13 MED ORDER — SODIUM CHLORIDE 0.9% FLUSH
3.0000 mL | Freq: Two times a day (BID) | INTRAVENOUS | Status: DC
  Administered 2019-06-13: 3 mL via INTRAVENOUS

## 2019-06-13 MED ORDER — PIOGLITAZONE HCL 30 MG PO TABS: 30.0000 mg | ORAL_TABLET | Freq: Every day | ORAL | Status: DC

## 2019-06-13 MED ORDER — ATENOLOL-CHLORTHALIDONE 100-25 MG PO TABS: 1.0000 | ORAL_TABLET | Freq: Every day | ORAL | Status: DC

## 2019-06-13 MED ORDER — CHLORHEXIDINE GLUCONATE CLOTH 2 % EX PADS: 6.0000 | MEDICATED_PAD | Freq: Once | CUTANEOUS | Status: DC

## 2019-06-13 MED ORDER — MIDAZOLAM HCL 2 MG/2ML IJ SOLN
INTRAMUSCULAR | Status: AC
  Filled 2019-06-13: qty 2

## 2019-06-13 MED ORDER — DEXAMETHASONE SODIUM PHOSPHATE 10 MG/ML IJ SOLN
INTRAMUSCULAR | Status: AC
  Filled 2019-06-13: qty 3

## 2019-06-13 MED ORDER — PROPOFOL 10 MG/ML IV BOLUS
INTRAVENOUS | Status: DC | PRN
  Administered 2019-06-13: 100 mg via INTRAVENOUS

## 2019-06-13 MED ORDER — THROMBIN 20000 UNITS EX SOLR
CUTANEOUS | Status: AC
  Filled 2019-06-13: qty 20000

## 2019-06-13 MED ORDER — CEFAZOLIN SODIUM-DEXTROSE 2-4 GM/100ML-% IV SOLN
2.0000 g | Freq: Three times a day (TID) | INTRAVENOUS | Status: AC
  Administered 2019-06-13 – 2019-06-14 (×2): 2 g via INTRAVENOUS
  Filled 2019-06-13 (×2): qty 100

## 2019-06-13 MED ORDER — FENTANYL CITRATE (PF) 250 MCG/5ML IJ SOLN
INTRAMUSCULAR | Status: DC | PRN
  Administered 2019-06-13 (×2): 50 ug via INTRAVENOUS
  Administered 2019-06-13: 100 ug via INTRAVENOUS

## 2019-06-13 MED ORDER — SODIUM CHLORIDE 0.9 % IV SOLN: INTRAVENOUS | Status: DC

## 2019-06-13 MED ORDER — ACETAMINOPHEN 325 MG PO TABS
650.0000 mg | ORAL_TABLET | ORAL | Status: DC | PRN
  Filled 2019-06-13: qty 2

## 2019-06-13 MED ORDER — THROMBIN 20000 UNITS EX SOLR
CUTANEOUS | Status: DC | PRN
  Administered 2019-06-13: 12:00:00 via TOPICAL

## 2019-06-13 MED ORDER — LIDOCAINE 2% (20 MG/ML) 5 ML SYRINGE
INTRAMUSCULAR | Status: AC
  Filled 2019-06-13: qty 10

## 2019-06-13 MED ORDER — ATENOLOL 50 MG PO TABS
100.0000 mg | ORAL_TABLET | Freq: Every day | ORAL | Status: DC
  Administered 2019-06-14 – 2019-06-16 (×3): 100 mg via ORAL
  Filled 2019-06-13 (×3): qty 2

## 2019-06-13 MED ORDER — ACETAMINOPHEN 500 MG PO TABS: 1000.0000 mg | ORAL_TABLET | Freq: Once | ORAL | Status: DC

## 2019-06-13 MED ORDER — FENTANYL CITRATE (PF) 100 MCG/2ML IJ SOLN
25.0000 ug | INTRAMUSCULAR | Status: DC | PRN
  Administered 2019-06-13: 16:00:00 50 ug via INTRAVENOUS
  Administered 2019-06-13 (×2): 25 ug via INTRAVENOUS

## 2019-06-13 MED ORDER — PROPOFOL 10 MG/ML IV BOLUS
INTRAVENOUS | Status: AC
  Filled 2019-06-13: qty 20

## 2019-06-13 MED ORDER — DEXAMETHASONE SODIUM PHOSPHATE 10 MG/ML IJ SOLN
INTRAMUSCULAR | Status: AC
  Filled 2019-06-13: qty 1

## 2019-06-13 MED ORDER — PHENOL 1.4 % MT LIQD: 1.0000 | OROMUCOSAL | Status: DC | PRN

## 2019-06-13 MED ORDER — FENTANYL CITRATE (PF) 250 MCG/5ML IJ SOLN
INTRAMUSCULAR | Status: AC
  Filled 2019-06-13: qty 5

## 2019-06-13 MED ORDER — SODIUM CHLORIDE 0.9 % IV SOLN
INTRAVENOUS | Status: DC | PRN
  Administered 2019-06-13: 15 ug/min via INTRAVENOUS

## 2019-06-13 MED ORDER — LIDOCAINE-EPINEPHRINE 1 %-1:100000 IJ SOLN
INTRAMUSCULAR | Status: AC
  Filled 2019-06-13: qty 1

## 2019-06-13 MED ORDER — CEFAZOLIN SODIUM-DEXTROSE 2-4 GM/100ML-% IV SOLN
2.0000 g | INTRAVENOUS | Status: AC
  Administered 2019-06-13: 2 g via INTRAVENOUS

## 2019-06-13 MED ORDER — MENTHOL 3 MG MT LOZG: 1.0000 | LOZENGE | OROMUCOSAL | Status: DC | PRN

## 2019-06-13 MED ORDER — BUPIVACAINE LIPOSOME 1.3 % IJ SUSP
INTRAMUSCULAR | Status: DC | PRN
  Administered 2019-06-13: 20 mL

## 2019-06-13 MED ORDER — CEFAZOLIN SODIUM-DEXTROSE 2-4 GM/100ML-% IV SOLN
INTRAVENOUS | Status: AC
  Filled 2019-06-13: qty 100

## 2019-06-13 MED ORDER — SENNA 8.6 MG PO TABS
1.0000 | ORAL_TABLET | Freq: Two times a day (BID) | ORAL | Status: DC
  Administered 2019-06-13 – 2019-06-16 (×6): 8.6 mg via ORAL
  Filled 2019-06-13 (×6): qty 1

## 2019-06-13 MED ORDER — ACETAMINOPHEN 650 MG RE SUPP: 650.0000 mg | RECTAL | Status: DC | PRN

## 2019-06-13 MED ORDER — SODIUM CHLORIDE 0.9 % IV SOLN: 250.0000 mL | INTRAVENOUS | Status: DC

## 2019-06-13 MED ORDER — ROCURONIUM BROMIDE 10 MG/ML (PF) SYRINGE
PREFILLED_SYRINGE | INTRAVENOUS | Status: DC | PRN
  Administered 2019-06-13: 60 mg via INTRAVENOUS
  Administered 2019-06-13: 20 mg via INTRAVENOUS
  Administered 2019-06-13: 40 mg via INTRAVENOUS

## 2019-06-13 MED ORDER — GABAPENTIN 600 MG PO TABS
600.0000 mg | ORAL_TABLET | Freq: Three times a day (TID) | ORAL | Status: DC | PRN
  Administered 2019-06-13 – 2019-06-16 (×8): 600 mg via ORAL
  Filled 2019-06-13 (×8): qty 1

## 2019-06-13 MED ORDER — POLYETHYLENE GLYCOL 3350 17 G PO PACK: 17.0000 g | PACK | Freq: Every day | ORAL | Status: DC | PRN

## 2019-06-13 SURGICAL SUPPLY — 85 items
ADH SKN CLS APL DERMABOND .7 (GAUZE/BANDAGES/DRESSINGS) ×1
APL SKNCLS STERI-STRIP NONHPOA (GAUZE/BANDAGES/DRESSINGS) ×1
BAG DECANTER FOR FLEXI CONT (MISCELLANEOUS) ×3 IMPLANT
BENZOIN TINCTURE PRP APPL 2/3 (GAUZE/BANDAGES/DRESSINGS) ×3 IMPLANT
BLADE CLIPPER SURG (BLADE) IMPLANT
BLADE SURG 11 STRL SS (BLADE) ×3 IMPLANT
BONE VIVIGEN FORMABLE 5.4CC (Bone Implant) ×3 IMPLANT
BUR CUTTER 7.0 ROUND (BURR) ×3 IMPLANT
BUR MATCHSTICK NEURO 3.0 LAGG (BURR) ×3 IMPLANT
CANISTER SUCT 3000ML PPV (MISCELLANEOUS) ×3 IMPLANT
CAP LOCKING THREADED (Cap) ×12 IMPLANT
CARTRIDGE OIL MAESTRO DRILL (MISCELLANEOUS) ×1 IMPLANT
CLOSURE WOUND 1/2 X4 (GAUZE/BANDAGES/DRESSINGS) ×1
CONT SPEC 4OZ CLIKSEAL STRL BL (MISCELLANEOUS) ×3 IMPLANT
COVER BACK TABLE 60X90IN (DRAPES) ×3 IMPLANT
COVER WAND RF STERILE (DRAPES) ×3 IMPLANT
CROSSLINK SPINAL FUSION (Cage) ×2 IMPLANT
DECANTER SPIKE VIAL GLASS SM (MISCELLANEOUS) ×3 IMPLANT
DERMABOND ADVANCED (GAUZE/BANDAGES/DRESSINGS) ×2
DERMABOND ADVANCED .7 DNX12 (GAUZE/BANDAGES/DRESSINGS) ×1 IMPLANT
DIFFUSER DRILL AIR PNEUMATIC (MISCELLANEOUS) ×3 IMPLANT
DRAPE C-ARM 42X72 X-RAY (DRAPES) ×5 IMPLANT
DRAPE C-ARMOR (DRAPES) IMPLANT
DRAPE HALF SHEET 40X57 (DRAPES) IMPLANT
DRAPE LAPAROTOMY 100X72X124 (DRAPES) ×3 IMPLANT
DRAPE SURG 17X23 STRL (DRAPES) ×3 IMPLANT
DRSG OPSITE 4X5.5 SM (GAUZE/BANDAGES/DRESSINGS) ×3 IMPLANT
DRSG OPSITE POSTOP 4X6 (GAUZE/BANDAGES/DRESSINGS) ×3 IMPLANT
DRSG OPSITE POSTOP 4X8 (GAUZE/BANDAGES/DRESSINGS) ×2 IMPLANT
DURAPREP 26ML APPLICATOR (WOUND CARE) ×3 IMPLANT
ELECT REM PT RETURN 9FT ADLT (ELECTROSURGICAL) ×3
ELECTRODE REM PT RTRN 9FT ADLT (ELECTROSURGICAL) ×1 IMPLANT
EVACUATOR 3/16  PVC DRAIN (DRAIN) ×2
EVACUATOR 3/16 PVC DRAIN (DRAIN) ×1 IMPLANT
GAUZE 4X4 16PLY RFD (DISPOSABLE) IMPLANT
GAUZE SPONGE 4X4 12PLY STRL (GAUZE/BANDAGES/DRESSINGS) IMPLANT
GLOVE BIO SURGEON STRL SZ7 (GLOVE) ×2 IMPLANT
GLOVE BIO SURGEON STRL SZ8 (GLOVE) ×6 IMPLANT
GLOVE BIOGEL PI IND STRL 6.5 (GLOVE) IMPLANT
GLOVE BIOGEL PI IND STRL 7.0 (GLOVE) IMPLANT
GLOVE BIOGEL PI INDICATOR 6.5 (GLOVE) ×2
GLOVE BIOGEL PI INDICATOR 7.0 (GLOVE) ×4
GLOVE EXAM NITRILE XL STR (GLOVE) IMPLANT
GLOVE INDICATOR 8.5 STRL (GLOVE) ×6 IMPLANT
GLOVE SURG SS PI 6.0 STRL IVOR (GLOVE) ×8 IMPLANT
GOWN STRL REUS W/ TWL LRG LVL3 (GOWN DISPOSABLE) IMPLANT
GOWN STRL REUS W/ TWL XL LVL3 (GOWN DISPOSABLE) ×2 IMPLANT
GOWN STRL REUS W/TWL 2XL LVL3 (GOWN DISPOSABLE) IMPLANT
GOWN STRL REUS W/TWL LRG LVL3 (GOWN DISPOSABLE) ×6
GOWN STRL REUS W/TWL XL LVL3 (GOWN DISPOSABLE) ×6
GRAFT BNE MATRIX VG FRMBL MD 5 (Bone Implant) IMPLANT
HEMOSTAT POWDER KIT SURGIFOAM (HEMOSTASIS) IMPLANT
KIT BASIN OR (CUSTOM PROCEDURE TRAY) ×3 IMPLANT
KIT INFUSE SMALL (Orthopedic Implant) ×3 IMPLANT
KIT POSITION SURG JACKSON T1 (MISCELLANEOUS) ×3 IMPLANT
KIT TURNOVER KIT B (KITS) ×3 IMPLANT
MILL MEDIUM DISP (BLADE) ×1 IMPLANT
NDL HYPO 21X1.5 SAFETY (NEEDLE) ×1 IMPLANT
NDL HYPO 25X1 1.5 SAFETY (NEEDLE) ×1 IMPLANT
NEEDLE HYPO 21X1.5 SAFETY (NEEDLE) ×3 IMPLANT
NEEDLE HYPO 25X1 1.5 SAFETY (NEEDLE) ×3 IMPLANT
NS IRRIG 1000ML POUR BTL (IV SOLUTION) ×3 IMPLANT
OIL CARTRIDGE MAESTRO DRILL (MISCELLANEOUS) ×3
PACK LAMINECTOMY NEURO (CUSTOM PROCEDURE TRAY) ×3 IMPLANT
PAD ARMBOARD 7.5X6 YLW CONV (MISCELLANEOUS) ×9 IMPLANT
ROD 65MM SPINAL (Rod) ×2 IMPLANT
ROD 70MM SPINAL (Rod) ×2 IMPLANT
ROD SPNL 5.5 CREO TI 65 (Rod) IMPLANT
SCREW AMP MODULAR CREO 6.5X45 (Screw) ×4 IMPLANT
SCREW CREO 5.5X40 (Screw) ×4 IMPLANT
SCREW CREO MOD AMP 5.5X45MM (Screw) ×9 IMPLANT
SCREW PA THRD CREO TULIP 5.5X4 (Head) ×18 IMPLANT
SPACER SUSTAIN RT TI 8X22X11 8 (Spacer) ×4 IMPLANT
SPONGE LAP 4X18 RFD (DISPOSABLE) IMPLANT
SPONGE SURGIFOAM ABS GEL 100 (HEMOSTASIS) ×3 IMPLANT
STRIP CLOSURE SKIN 1/2X4 (GAUZE/BANDAGES/DRESSINGS) ×3 IMPLANT
SUT VIC AB 0 CT1 18XCR BRD8 (SUTURE) ×1 IMPLANT
SUT VIC AB 0 CT1 8-18 (SUTURE) ×6
SUT VIC AB 2-0 CT1 18 (SUTURE) ×6 IMPLANT
SUT VIC AB 4-0 PS2 27 (SUTURE) ×3 IMPLANT
SYR 20ML LL LF (SYRINGE) ×3 IMPLANT
TOWEL GREEN STERILE (TOWEL DISPOSABLE) ×3 IMPLANT
TOWEL GREEN STERILE FF (TOWEL DISPOSABLE) ×3 IMPLANT
TRAY FOLEY MTR SLVR 16FR STAT (SET/KITS/TRAYS/PACK) ×3 IMPLANT
WATER STERILE IRR 1000ML POUR (IV SOLUTION) ×3 IMPLANT

## 2019-06-13 NOTE — Anesthesia Preprocedure Evaluation (Signed)
Anesthesia Evaluation  Patient identified by MRN, date of birth, ID band Patient awake    Reviewed: Allergy & Precautions, NPO status , Patient's Chart, lab work & pertinent test results  History of Anesthesia Complications (+) history of anesthetic complications ("Anesthesia recall x2")  Airway Mallampati: II  TM Distance: >3 FB Neck ROM: Full    Dental  (+) Teeth Intact, Dental Advisory Given   Pulmonary Current Smoker and Patient abstained from smoking.,    Pulmonary exam normal breath sounds clear to auscultation       Cardiovascular hypertension, Normal cardiovascular exam Rhythm:Regular Rate:Normal     Neuro/Psych  Headaches,    GI/Hepatic GERD  ,(+) Hepatitis -, C  Endo/Other  diabetes, Type 2  Renal/GU negative Renal ROS     Musculoskeletal  (+) Arthritis ,   Abdominal   Peds  Hematology negative hematology ROS (+)   Anesthesia Other Findings Day of surgery medications reviewed with the patient.  Reproductive/Obstetrics                            Anesthesia Physical Anesthesia Plan  ASA: II  Anesthesia Plan: General   Post-op Pain Management:    Induction: Intravenous  PONV Risk Score and Plan: 3 and Midazolam, Dexamethasone and Ondansetron  Airway Management Planned: Oral ETT  Additional Equipment:   Intra-op Plan:   Post-operative Plan: Extubation in OR  Informed Consent: I have reviewed the patients History and Physical, chart, labs and discussed the procedure including the risks, benefits and alternatives for the proposed anesthesia with the patient or authorized representative who has indicated his/her understanding and acceptance.     Dental advisory given  Plan Discussed with: CRNA  Anesthesia Plan Comments:         Anesthesia Quick Evaluation

## 2019-06-13 NOTE — Transfer of Care (Signed)
Immediate Anesthesia Transfer of Care Note  Patient: Angel Wall  Procedure(s) Performed: POSTERIOR LUMBAR  FUSION - LUMBAR ONE-LUMBAR TWO - LUMBAR TWO-LUMBAR THREE (N/A Spine Lumbar)  Patient Location: PACU  Anesthesia Type:General  Level of Consciousness: awake, alert , oriented and patient cooperative  Airway & Oxygen Therapy: Patient Spontanous Breathing  Post-op Assessment: Report given to RN and Post -op Vital signs reviewed and stable  Post vital signs: Reviewed and stable  Last Vitals:  Vitals Value Taken Time  BP 96/68 06/13/19 1508  Temp    Pulse 77 06/13/19 1509  Resp 16 06/13/19 1509  SpO2 91 % 06/13/19 1509  Vitals shown include unvalidated device data.  Last Pain:  Vitals:   06/13/19 0915  TempSrc:   PainSc: 8       Patients Stated Pain Goal: 5 ([phone] [phone])  Complications: No apparent anesthesia complications

## 2019-06-13 NOTE — Anesthesia Procedure Notes (Signed)
Procedure Name: Intubation Date/Time: 06/13/2019 11:08 AM Performed by: Larene Beach, CRNA Pre-anesthesia Checklist: Patient identified, Emergency Drugs available, Suction available and Patient being monitored Patient Re-evaluated:Patient Re-evaluated prior to induction Oxygen Delivery Method: Circle system utilized Preoxygenation: Pre-oxygenation with 100% oxygen Induction Type: IV induction Ventilation: Mask ventilation without difficulty Laryngoscope Size: Mac and 3 Grade View: Grade II Tube type: Oral Tube size: 7.0 mm Number of attempts: 1 Airway Equipment and Method: Stylet Placement Confirmation: ETT inserted through vocal cords under direct vision,  positive ETCO2 and breath sounds checked- equal and bilateral Secured at: 21 cm Tube secured with: Tape Dental Injury: Teeth and Oropharynx as per pre-operative assessment  Comments: Performed by Dr. Gifford Shave

## 2019-06-13 NOTE — Op Note (Signed)
Preoperative diagnosis: Lumbar spinal stenosis L1-L2 3 degenerative disease L1-L2 3 bilateral L2-L3 radiculopathies  Postoperative diagnosis: Same  Procedure: #1 decompressive lumbar laminectomy L1-L2 with complete medial facetectomies and foraminotomies of the L1 and L2 nerve root  2.  Decompressive lumbar laminectomy L2-3 with complete medial facetectomies and radical foraminotomies of the L2 and L3 nerve root in excess and requiring more work than would be needed with a standard interbody fusion.    #3 posterior lumbar interbody fusion L2-3 utilizing the globus titanium insert and rotate cages packed with locally harvested autograft mixed with vivigen and BMP.  4.  Pedicle screw fixation L1-L3 utilizing the globus Creo amp modular pedicle screw set  5.  Posterior lateral arthrodesis usual L1 L3 utilizing the locally harvested autograft mixed with division and BMP  Surgeon: Dominica Severin Anyely Cunning  Assistant: Ashok Pall  Anesthesia: General  EBL: Minimal  HPI: 68 year old female with progressive worsening back and bilateral hip and leg pain previously undergone L3-S1 fusion and did Very well however was noted to have progressive spinal stenosis at L1-2 and L2-3 with neurogenic claudication L2 and L3 radiculopathies.  Due to the patient's failed conservative treatment imaging findings and progressive clinical syndrome I recommended decompressive laminectomies L1-L2 and L2-3 I extensively went over the risks and benefits of the operation with her as well as the interbody fusions and pedicle screw fixation with posterior lateral arthrodesis.  We also went over perioperative course expectations of outcome and alternatives of surgery and she understood and agreed to proceed forward.    Operative procedure: Patient brought into the OR was induced and general anesthesia positioned prone the Wilson frame her back was prepped and draped in routine sterile fashion her old incision was opened up and extended  cephalad subperiosteal dissection was carried lamina of L1-L2-L3 and the inferior aspect of T12.  Identified the TPs at L1-L2 and L3 confirmed by intraoperative x-ray.  Then I remove the spinous process at L1 and L2 performed a central decompression with complete medial facetectomies.  Completely unroofed the L1-L2 foramina and extended the L3 foramen unroofed foraminotomy down below the L3 pedicle.  There was marked and severe hourglass compression primarily at L2-3 but also at L1-L2 this was all decompressed aggressively under biting supraventricular and facet at both levels.  Then the epidurograms coagulated the disc base was incised at L2-3 there was not enough room in the canal at L1-L2 to perform interbody work so I elected to just do a posterior lateral arthrodesis at that level.  With sequential distraction and a 10 distractor in place I opened up the 8 mm wide 11 mm trial insert and rotate globus peek cages packed with locally harvested autograft mixed with vivigen after adequate endplate preparation been and discectomy bilaterally inserted 1 cage packed an extensive amount of autograft mix centrally and inserted the contralateral cage in a similar fashion.  Fluoroscopy confirmed good position of the implants.  Then under fluoroscopy I placed the L1-L3 pedicle screws utilizing 5.5 mm diameter screws at L1 and L2 and 6.5 at L3 I did replace the left L2 screw when it had migrated laterally it was a small pedicle.  I did see a part of the medial aspect of the dorsal facet and pedicle had a defect marked skiing with the threads of the screw but they were nowhere near the nerve roots I packed it off with wax there was good anchoring of the vertebral body anteriorly.  Then assembled the heads after aggressively decorticated the TPs  lateral facet complexes and packing the BMP and autograft mix posterior laterally from L1 and L3.  Then anchored all the rods in place tightened everything down inspected the foramina  to confirm patency and no migration of graft material.  Gelfoam on the dura placed a cross-link a large Hemovac drain injected Exparel in the fascia.  I then closed the wound in layers with Vicryl running 4-0 subcuticular Dermabond benzoin Steri-Strips and a sterile dressing patient recovery in stable condition.  At the end the case on the account sponge counts were correct.

## 2019-06-13 NOTE — H&P (Signed)
Angel Wall is an 68 y.o. female.   Chief Complaint: Back and bilateral hip and leg pain neurogenic claudication HPI: 68 year old female previous history of an L3-S1 fusion and is developed progressive worsening back pain bilateral hip and leg pain rating down in the anterior quad medial thigh work-up revealed severe spinal stenosis nerve root clumping and progressive degenerative disc disease at L1-2 and L2-3.  Due to the patient's progression of clinical syndrome imaging findings and failed conservative treatment I recommended decompressive laminectomies at L1-2 and L2-3 with posterior lumbar interbody fusions at those levels.  I have extensively gone over the risks and benefits of that operation with her as well as perioperative course expectations of outcome and alternatives to surgery and she understands and agrees to proceed forward.  Past Medical History:  Diagnosis Date  . Arthritis   . Complication of anesthesia    anesthesia recall x 2  . Diabetes mellitus without complication ([place])   . Fatty liver   . Hepatitis 1990   Hepatitis C from blood transfusion in 1990s  . Hypertension     Past Surgical History:  Procedure Laterality Date  . ABDOMINAL HYSTERECTOMY  1993  . BACK SURGERY    . CHOLECYSTECTOMY  2018  . FRACTURE SURGERY    . ORIF TIBIA & FIBULA FRACTURES  2010  . SPINAL FUSION  1992  . [place]    History reviewed. No pertinent family history. Social History:  reports that she has been smoking cigarettes. She has a 25.00 pack-year smoking history. She has never used smokeless tobacco. She reports current alcohol use of about 12.0 - 14.0 standard drinks of alcohol per week. No history on file for drug.  Allergies:  Allergies  Allergen Reactions  . Aspirin Other (See Comments)    History of GI bleed      Medications Prior to Admission  Medication Sig Dispense Refill  . atenolol-chlorthalidone (TENORETIC) 100-25 MG tablet Take 1 tablet by mouth  daily.     . celecoxib (CELEBREX) 200 MG capsule Take 200 mg by mouth daily.     . cyclobenzaprine (FLEXERIL) 10 MG tablet Take 10 mg by mouth 3 (three) times daily as needed for muscle spasms.     . hydroxypropyl methylcellulose / hypromellose (ISOPTO TEARS / GONIOVISC) 2.5 % ophthalmic solution Place 1 drop into both eyes daily as needed for dry eyes.    . metFORMIN (GLUCOPHAGE-XR) 500 MG 24 hr tablet Take 1,000 mg by mouth 2 (two) times daily.     . pantoprazole (PROTONIX) 40 MG tablet Take 40 mg by mouth daily.     . pioglitazone (ACTOS) 30 MG tablet Take 30 mg by mouth daily.     . simvastatin (ZOCOR) 10 MG tablet Take 10 mg by mouth every evening.     . gabapentin (NEURONTIN) 600 MG tablet Take 600 mg by mouth 3 (three) times daily as needed (nerve pain).     . Lancets MISC Check blood glucose twice daily / DX: E11.65      Results for orders placed or performed during the hospital encounter of 06/13/19 (from the past 48 hour(s))  Glucose, capillary     Status: Abnormal   Collection Time: 06/13/19  8:52 AM  Result Value Ref Range   Glucose-Capillary 134 (H) 70 - 99 mg/dL   No results found.  Review of Systems  Musculoskeletal: Positive for back pain.  Neurological: Positive for tingling and sensory change.    Blood pressure 136/82, pulse  74, temperature 98.7 F (37.1 C), temperature source Oral, resp. rate 18, height 5\' 4"  (1.626 m), weight 78.3 kg, SpO2 98 %. Physical Exam  Constitutional: She is oriented to person, place, and time. She appears well-developed.  HENT:  Head: Normocephalic.  Eyes: Pupils are equal, round, and reactive to light.  Neck: Normal range of motion.  Respiratory: Effort normal.  GI: Soft.  Musculoskeletal: Normal range of motion.  Neurological: She is alert and oriented to person, place, and time. She has normal strength. GCS eye subscore is 4. GCS verbal subscore is 5. GCS motor subscore is 6.  Strength 5 out of 5 iliopsoas quads hamstrings gastrocs  EHL  Skin: Skin is warm and dry.     Assessment/Plan 69-year female presents for decompressive laminectomy interbody fusion L1-L2 3  Stassi Fadely P, MD 06/13/2019, 10:13 AM

## 2019-06-14 DIAGNOSIS — Z79899 Other long term (current) drug therapy: Secondary | ICD-10-CM | POA: Diagnosis not present

## 2019-06-14 DIAGNOSIS — K760 Fatty (change of) liver, not elsewhere classified: Secondary | ICD-10-CM | POA: Diagnosis present

## 2019-06-14 DIAGNOSIS — Z20828 Contact with and (suspected) exposure to other viral communicable diseases: Secondary | ICD-10-CM | POA: Diagnosis present

## 2019-06-14 DIAGNOSIS — M5136 Other intervertebral disc degeneration, lumbar region: Secondary | ICD-10-CM | POA: Diagnosis present

## 2019-06-14 DIAGNOSIS — Z7984 Long term (current) use of oral hypoglycemic drugs: Secondary | ICD-10-CM | POA: Diagnosis not present

## 2019-06-14 DIAGNOSIS — M4726 Other spondylosis with radiculopathy, lumbar region: Secondary | ICD-10-CM | POA: Diagnosis present

## 2019-06-14 DIAGNOSIS — E119 Type 2 diabetes mellitus without complications: Secondary | ICD-10-CM | POA: Diagnosis present

## 2019-06-14 DIAGNOSIS — Z7982 Long term (current) use of aspirin: Secondary | ICD-10-CM | POA: Diagnosis not present

## 2019-06-14 DIAGNOSIS — Z90710 Acquired absence of both cervix and uterus: Secondary | ICD-10-CM | POA: Diagnosis not present

## 2019-06-14 DIAGNOSIS — I10 Essential (primary) hypertension: Secondary | ICD-10-CM | POA: Diagnosis present

## 2019-06-14 DIAGNOSIS — F17210 Nicotine dependence, cigarettes, uncomplicated: Secondary | ICD-10-CM | POA: Diagnosis present

## 2019-06-14 DIAGNOSIS — M48062 Spinal stenosis, lumbar region with neurogenic claudication: Secondary | ICD-10-CM | POA: Diagnosis present

## 2019-06-14 DIAGNOSIS — Z9049 Acquired absence of other specified parts of digestive tract: Secondary | ICD-10-CM | POA: Diagnosis not present

## 2019-06-14 LAB — GLUCOSE, CAPILLARY
Glucose-Capillary: 133 mg/dL — ABNORMAL HIGH (ref 70–99)
Glucose-Capillary: 155 mg/dL — ABNORMAL HIGH (ref 70–99)
Glucose-Capillary: 158 mg/dL — ABNORMAL HIGH (ref 70–99)
Glucose-Capillary: 143 mg/dL — ABNORMAL HIGH (ref 70–99)

## 2019-06-14 NOTE — Progress Notes (Signed)
Subjective: Patient reports back pain no leg pain  Objective: Vital signs in last 24 hours: Temp:  [97.5 F (36.4 C)-98.9 F (37.2 C)] 97.7 F (36.5 C) (10/15 0749) Pulse Rate:  [72-87] 87 (10/15 0749) Resp:  [8-20] 8 (10/15 0749) BP: (96-136)/(60-82) 108/68 (10/15 0749) SpO2:  [90 %-99 %] 99 % (10/15 0749) Weight:  [78.3 kg] 78.3 kg (10/14 0915)  Intake/Output from previous day: 10/14 0701 - 10/15 0700 In: 4404.9 [I.V.:2600; Blood:240; IV Piggyback:1564.9] Out: 1780 [Urine:790; Drains:290; Blood:700] Intake/Output this shift: No intake/output data recorded.  strength 5/5  Lab Results: Recent Labs    06/11/19 1130  WBC 9.9  HGB 16.1*  HCT 47.2*  PLT 228   BMET Recent Labs    06/11/19 1145  NA 138  K 3.8  CL 98  CO2 27  GLUCOSE 140*  BUN 12  CREATININE 0.95  CALCIUM 9.2    Studies/Results: Dg Lumbar Spine 2-3 Views  Result Date: 06/13/2019 CLINICAL DATA:  L1 through L3 posterior lumbar spine fusion. EXAM: DG C-ARM 1-60 MIN; LUMBAR SPINE - 2-3 VIEW FLUOROSCOPY TIME:  Fluoroscopy Time:  1 minutes and 4 seconds Number of Acquired Spot Images: 2 COMPARISON:  10/04/2017.  CT, 03/05/2019. FINDINGS: Submitted images show pedicle screws at L1, L2 and L3, which appear well seated and positioned. Intervertebral cages are well centered at the L2-L3 level. Posterior fusion hardware extending inferiorly from L3 is partly imaged stable from the prior CT. IMPRESSION: Portable operative imaging performed for L1 through L3 posterior lumbar spine fusion. Electronically Signed   By: Lajean Manes M.D.   On: 06/13/2019 15:55   Dg C-arm 1-60 Min  Result Date: 06/13/2019 CLINICAL DATA:  L1 through L3 posterior lumbar spine fusion. EXAM: DG C-ARM 1-60 MIN; LUMBAR SPINE - 2-3 VIEW FLUOROSCOPY TIME:  Fluoroscopy Time:  1 minutes and 4 seconds Number of Acquired Spot Images: 2 COMPARISON:  10/04/2017.  CT, 03/05/2019. FINDINGS: Submitted images show pedicle screws at L1, L2 and L3, which  appear well seated and positioned. Intervertebral cages are well centered at the L2-L3 level. Posterior fusion hardware extending inferiorly from L3 is partly imaged stable from the prior CT. IMPRESSION: Portable operative imaging performed for L1 through L3 posterior lumbar spine fusion. Electronically Signed   By: Lajean Manes M.D.   On: 06/13/2019 15:55    Assessment/Plan: POD 1 PLIF L1-3 doing well, mobilize with PT  LOS: 1 day     Angel Wall P 06/14/2019, 8:17 AM

## 2019-06-14 NOTE — Progress Notes (Signed)
Orthopedic Tech Progress Note Patient Details:  Angel Wall March 31, 1951 [id number] RN said patient has brace Patient ID: Angel Wall, female   DOB: April 08, 1951, 68 y.o.   MRN: [id number]   [name] 06/14/2019, 7:48 AM

## 2019-06-14 NOTE — Evaluation (Signed)
Occupational Therapy Evaluation Patient Details Name: Angel Wall MRN: [id number] DOB: [birth year] Today's Date: 06/14/2019    History of Present Illness Pt is a 68 y/o female who presents s/p L1-L3 PLIF on 06/13/2019. PMH significant for  HTN, hepatitis C, DM, spinal fusion 1975 &1992, ORIF tibia/fibula fractures.    Clinical Impression   Pt was ambulating with a cane, otherwise independent in self care prior to admission. Presents with post operative pain and decreased standing balance. Pt educated in back precautions related to ADL and mobility, compensatory strategies for ADL, IADL to avoid and use of back brace. Pt verbalized and/or demonstrated understanding. No further OT needs.     Follow Up Recommendations  No OT follow up    Equipment Recommendations  3 in 1 bedside commode    Recommendations for Other Services       Precautions / Restrictions Precautions Precautions: Back;Fall Precaution Booklet Issued: No Precaution Comments: Reviewed precautions verbally during functional mobility, ADL and IADL.  Required Braces or Orthoses: Spinal Brace Spinal Brace: Lumbar corset;Applied in sitting position Restrictions Weight Bearing Restrictions: No      Mobility Bed Mobility Overal bed mobility: Needs Assistance Bed Mobility: Sit to Sidelying;Rolling Rolling: Modified independent (Device/Increase time)      Sit to sidelying: Min assist General bed mobility comments: assist for LEs into bed, cues for log roll technique  Transfers Overall transfer level: Needs assistance Equipment used: None Transfers: Sit to/from Stand Sit to Stand: Supervision         General transfer comment: slow to stand and steady, but no physical assist needed    Balance Overall balance assessment: Needs assistance Sitting-balance support: Feet supported;No upper extremity supported Sitting balance-Leahy Scale: Good Sitting balance - Comments: no LOB donning socks   Standing  balance support: No upper extremity supported;During functional activity Standing balance-Leahy Scale: Poor Standing balance comment: Fair to poor. Static activity pt does not require UE support however with dynamic activity/walking, requires intermittent UE support.                            ADL either performed or assessed with clinical judgement   ADL Overall ADL's : Needs assistance/impaired Eating/Feeding: Independent;Sitting   Grooming: Supervision/safety;Sitting;Oral care Grooming Details (indicate cue type and reason): educated in 2 cup method for toothbrushing and use of washcloth for face  Upper Body Bathing: Set up;Sitting Upper Body Bathing Details (indicate cue type and reason): recommended long handled bath sponge for back Lower Body Bathing: Supervison/ safety;Sit to/from stand   Upper Body Dressing : Set up;Sitting   Lower Body Dressing: Supervision/safety;Sit to/from stand Lower Body Dressing Details (indicate cue type and reason): able to cross foot over opposite knee Toilet Transfer: Min guard;Ambulation   Toileting- Clothing Manipulation and Hygiene: Supervision/safety;Sit to/from stand Toileting - Clothing Manipulation Details (indicate cue type and reason): instructed to avoid twisting with pericare     Functional mobility during ADLs: Min guard(walks reaching for furniture/wall) General ADL Comments: Pt instructed to defer laundry, housekeeping and changing sheet on bed to family.     Vision Baseline Vision/History: Wears glasses       Perception     Praxis      Pertinent Vitals/Pain Pain Assessment: Faces Faces Pain Scale: Hurts even more Pain Location: Incision site Pain Descriptors / Indicators: Operative site guarding;Grimacing Pain Intervention(s): Monitored during session;Repositioned;Premedicated before session     Hand Dominance Right   Extremity/Trunk Assessment Upper Extremity Assessment Upper Extremity  Assessment: Overall  WFL for tasks assessed   Lower Extremity Assessment Lower Extremity Assessment: Defer to PT evaluation       Communication Communication Communication: No difficulties   Cognition Arousal/Alertness: Awake/alert Behavior During Therapy: WFL for tasks assessed/performed Overall Cognitive Status: Within Functional Limits for tasks assessed                                     General Comments       Exercises     Shoulder Instructions      Home Living Family/patient expects to be discharged to:: Private residence Living Arrangements: Spouse/significant other Available Help at Discharge: Family;Available PRN/intermittently Type of Home: House Home Access: Stairs to enter [employer] of Steps: 6 Entrance Stairs-Rails: Left Home Layout: One level     Bathroom Shower/Tub: Tub/shower unit;Walk-in shower   Bathroom Toilet: Standard     Home Equipment: Cane - single point   Additional Comments: shower seat she has is very old      Prior Functioning/Environment Level of Independence: Independent with assistive device(s)        Comments: Using [facility]PC PTA        OT Problem List:        OT Treatment/Interventions:      OT Goals(Current goals can be found in the care plan section) Acute Rehab OT Goals Patient Stated Goal: Pain control and return home today.  Potential to Achieve Goals: Good  OT Frequency:     Barriers to D/C:            Co-evaluation              AM-PAC OT "6 Clicks" Daily Activity     Outcome Measure Help from another person eating meals?: None Help from another person taking care of personal grooming?: A Little Help from another person toileting, which includes using toliet, bedpan, or urinal?: A Little Help from another person bathing (including washing, rinsing, drying)?: A Little Help from another person to put on and taking off regular upper body clothing?: None Help from another person to put on and  taking off regular lower body clothing?: A Little 6 Click Score: 20   End of Session Equipment Utilized During Treatment: Back brace  Activity Tolerance: Patient tolerated treatment well Patient left: in bed;with call bell/phone within reach  OT Visit Diagnosis: Pain;Other abnormalities of gait and mobility (R26.89)                Time: [id number] OT Time Calculation (min): 30 min Charges:  OT General Charges $OT Visit: 1 Visit OT Evaluation $OT Eval Moderate Complexity: 1 Mod OT Treatments $Self Care/Home Management : 8-22 mins  Nestor Lewandowsky, OTR/L Acute Rehabilitation Services Pager: [phone] Office: [phone]  Malka So 06/14/2019, 12:28 PM

## 2019-06-14 NOTE — Anesthesia Postprocedure Evaluation (Signed)
Anesthesia Post Note  Patient: Angel Wall  Procedure(s) Performed: POSTERIOR LUMBAR  FUSION - LUMBAR ONE-LUMBAR TWO - LUMBAR TWO-LUMBAR THREE (N/A Spine Lumbar)     Patient location during evaluation: PACU Anesthesia Type: General Level of consciousness: awake and alert Pain management: pain level controlled Vital Signs Assessment: post-procedure vital signs reviewed and stable Respiratory status: spontaneous breathing, nonlabored ventilation and respiratory function stable Cardiovascular status: blood pressure returned to baseline and stable Postop Assessment: no apparent nausea or vomiting Anesthetic complications: no    Last Vitals:  Vitals:   06/14/19 0320 06/14/19 0749  BP: 102/63 108/68  Pulse: 77 87  Resp: 18 (!) 8  Temp: 36.8 C 36.5 C  SpO2: 94% 99%    Last Pain:  Vitals:   06/14/19 0749  TempSrc: Oral  PainSc:                  Catalina Gravel

## 2019-06-14 NOTE — Evaluation (Signed)
Physical Therapy Evaluation Patient Details Name: Angel Wall MRN: [id number] DOB: 11/30/1950 Today's Date: 06/14/2019   History of Present Illness  Pt is a 68 y/o female who presents s/p L1-L3 PLIF on 06/13/2019. PMH significant for  HTN, helatitis C, DM, spinal fusion 1975 &1992, ORIF tibia/fibula fractures.   Clinical Impression  Pt admitted with above diagnosis. At the time of PT eval, pt was able to demonstrate transfers and ambulation with gross min guard assist to supervision for safety without an AD. Feel pt would benefit from [facility] use at least initially as she was reaching out for external support throughout OOB mobility, however pt declining. She reports using a cane PTA, so she does have one at home if needbe. Pt was educated on precautions, brace application/wearing schedule, appropriate activity progression, and car transfer. Pt currently with functional limitations due to the deficits listed below (see PT Problem List). Pt will benefit from skilled PT to increase their independence and safety with mobility to allow discharge to the venue listed below.      Follow Up Recommendations No PT follow up;Supervision for mobility/OOB    Equipment Recommendations  None recommended by PT    Recommendations for Other Services       Precautions / Restrictions Precautions Precautions: Back;Fall Precaution Booklet Issued: No Precaution Comments: Reviewed precautions verbally during functional mobility.  Required Braces or Orthoses: Spinal Brace Spinal Brace: Lumbar corset;Applied in sitting position Restrictions Weight Bearing Restrictions: No      Mobility  Bed Mobility Overal bed mobility: Needs Assistance Bed Mobility: Rolling;Sidelying to Sit Rolling: Modified independent (Device/Increase time) Sidelying to sit: Supervision       General bed mobility comments: Close supervision and VC's for improved log roll technique. Did not require hands on assist but increased  time required.   Transfers Overall transfer level: Needs assistance Equipment used: None Transfers: Sit to/from Stand Sit to Stand: Min guard         General transfer comment: Hands-on guarding for safety as pt unsteady upon first achieving upright posture. Pt reaching for wall almost immediately for support. Declines SPC when offered.   Ambulation/Gait Ambulation/Gait assistance: Min guard Gait Distance (Feet): 200 Feet Assistive device: None Gait Pattern/deviations: Step-through pattern;Decreased stride length;Trunk flexed Gait velocity: Decreased Gait velocity interpretation: <1.8 ft/sec, indicate of risk for recurrent falls General Gait Details: Unsteady with frequent reaching out for railings in hall. Pt again declines SPC but feel this would be beneficial for home. Pt reports fatigue during gait training and several short standing rest breaks taken.   Stairs Stairs: Yes Stairs assistance: Min guard Stair Management: One rail Left;Step to pattern;Forwards Number of Stairs: 6 General stair comments: VC's for sequencing and general safety. No assist required however pt moving very slow and hands-on guarding provided for safety.   Wheelchair Mobility    Modified Rankin (Stroke Patients Only)       Balance Overall balance assessment: Needs assistance Sitting-balance support: Feet supported;No upper extremity supported Sitting balance-Leahy Scale: Fair     Standing balance support: No upper extremity supported;During functional activity Standing balance-Leahy Scale: Poor Standing balance comment: Fair to poor. Static activity pt does not require UE support however with dynamic activity/walking, requires intermittent UE support.                              Pertinent Vitals/Pain Pain Assessment: Faces Faces Pain Scale: Hurts little more Pain Location: Incision site Pain Descriptors /  Indicators: Operative site guarding;Grimacing Pain Intervention(s):  Limited activity within patient's tolerance;Monitored during session;Repositioned    Home Living Family/patient expects to be discharged to:: Private residence Living Arrangements: Spouse/significant other Available Help at Discharge: Family;Available PRN/intermittently Type of Home: House Home Access: Stairs to enter Entrance Stairs-Rails: Left Entrance Stairs-Number of Steps: 6 Home Layout: One level Home Equipment: Cane - single point;Shower seat      Prior Function Level of Independence: Independent with assistive device(s)         Comments: Using SPC PTA     Hand Dominance        Extremity/Trunk Assessment   Upper Extremity Assessment Upper Extremity Assessment: Defer to OT evaluation    Lower Extremity Assessment Lower Extremity Assessment: Generalized weakness(Consistent with pre-op diagnosis)       Communication   Communication: No difficulties  Cognition Arousal/Alertness: Awake/alert Behavior During Therapy: WFL for tasks assessed/performed Overall Cognitive Status: Within Functional Limits for tasks assessed                                        General Comments      Exercises     Assessment/Plan    PT Assessment Patient needs continued PT services  PT Problem List Decreased strength;Decreased activity tolerance;Decreased balance;Decreased mobility;Decreased knowledge of use of DME;Decreased safety awareness;Decreased knowledge of precautions;Pain       PT Treatment Interventions DME instruction;Gait training;Functional mobility training;Therapeutic activities;Therapeutic exercise;Neuromuscular re-education;Patient/family education    PT Goals (Current goals can be found in the Care Plan section)  Acute Rehab PT Goals Patient Stated Goal: Pain control and return home today.  PT Goal Formulation: With patient Time For Goal Achievement: 06/21/19 Potential to Achieve Goals: Good    Frequency Min 5X/week   Barriers to  discharge Decreased caregiver support Husband has PTSD and pt reports he "disappears" at times when he feels episodes coming on. She reports no other support if he is not available to assist her at home.     Co-evaluation               AM-PAC PT "6 Clicks" Mobility  Outcome Measure Help needed turning from your back to your side while in a flat bed without using bedrails?: None Help needed moving from lying on your back to sitting on the side of a flat bed without using bedrails?: A Little Help needed moving to and from a bed to a chair (including a wheelchair)?: A Little Help needed standing up from a chair using your arms (e.g., wheelchair or bedside chair)?: A Little Help needed to walk in hospital room?: A Little Help needed climbing 3-5 steps with a railing? : A Little 6 Click Score: 19    End of Session Equipment Utilized During Treatment: Gait belt;Back brace Activity Tolerance: Patient tolerated treatment well Patient left: in chair;with call bell/phone within reach Nurse Communication: Mobility status PT Visit Diagnosis: Unsteadiness on feet (R26.81);Pain Pain - part of body: (back)    Time: [id number] PT Time Calculation (min) (ACUTE ONLY): 21 min   Charges:   PT Evaluation $PT Eval Moderate Complexity: 1 Mod          Rolinda Roan, PT, DPT Acute Rehabilitation Services Pager: [phone] Office: [phone]   Thelma Comp 06/14/2019, 11:35 AM

## 2019-06-15 LAB — GLUCOSE, CAPILLARY
Glucose-Capillary: 149 mg/dL — ABNORMAL HIGH (ref 70–99)
Glucose-Capillary: 121 mg/dL — ABNORMAL HIGH (ref 70–99)
Glucose-Capillary: 180 mg/dL — ABNORMAL HIGH (ref 70–99)
Glucose-Capillary: 202 mg/dL — ABNORMAL HIGH (ref 70–99)

## 2019-06-15 MED ORDER — DEXAMETHASONE 4 MG PO TABS
4.0000 mg | ORAL_TABLET | Freq: Four times a day (QID) | ORAL | Status: DC
  Administered 2019-06-15 – 2019-06-16 (×4): 4 mg via ORAL
  Filled 2019-06-15 (×4): qty 1

## 2019-06-15 MED ORDER — DEXAMETHASONE SODIUM PHOSPHATE 4 MG/ML IJ SOLN: 4.0000 mg | Freq: Four times a day (QID) | INTRAMUSCULAR | Status: DC

## 2019-06-15 MED FILL — Heparin Sodium (Porcine) Inj 1000 Unit/ML: INTRAMUSCULAR | Qty: 30 | Status: AC

## 2019-06-15 MED FILL — Sodium Chloride IV Soln 0.9%: INTRAVENOUS | Qty: 1000 | Status: AC

## 2019-06-15 MED FILL — Sodium Chloride Irrigation Soln 0.9%: Qty: 3000 | Status: AC

## 2019-06-15 NOTE — Progress Notes (Signed)
Subjective: Patient reports Condition of back pain no radicular pain  Objective: Vital signs in last 24 hours: Temp:  [97.5 F (36.4 C)-98.8 F (37.1 C)] 98.8 F (37.1 C) (10/16 0752) Pulse Rate:  [91-102] 102 (10/16 0752) Resp:  [18] 18 (10/16 0752) BP: (98-117)/(51-62) 101/62 (10/16 0752) SpO2:  [94 %-100 %] 95 % (10/16 0752)  Intake/Output from previous day: 10/15 0701 - 10/16 0700 In: -  Out: 240 [Drains:240] Intake/Output this shift: No intake/output data recorded.  Strength 5-5 wound clean dry and intact  Lab Results: No results for input(s): WBC, HGB, HCT, PLT in the last 72 hours. BMET No results for input(s): NA, K, CL, CO2, GLUCOSE, BUN, CREATININE, CALCIUM in the last 72 hours.  Studies/Results: Dg Lumbar Spine 2-3 Views  Result Date: 06/13/2019 CLINICAL DATA:  L1 through L3 posterior lumbar spine fusion. EXAM: DG C-ARM 1-60 MIN; LUMBAR SPINE - 2-3 VIEW FLUOROSCOPY TIME:  Fluoroscopy Time:  1 minutes and 4 seconds Number of Acquired Spot Images: 2 COMPARISON:  10/04/2017.  CT, 03/05/2019. FINDINGS: Submitted images show pedicle screws at L1, L2 and L3, which appear well seated and positioned. Intervertebral cages are well centered at the L2-L3 level. Posterior fusion hardware extending inferiorly from L3 is partly imaged stable from the prior CT. IMPRESSION: Portable operative imaging performed for L1 through L3 posterior lumbar spine fusion. Electronically Signed   By: Lajean Manes M.D.   On: 06/13/2019 15:55   Dg C-arm 1-60 Min  Result Date: 06/13/2019 CLINICAL DATA:  L1 through L3 posterior lumbar spine fusion. EXAM: DG C-ARM 1-60 MIN; LUMBAR SPINE - 2-3 VIEW FLUOROSCOPY TIME:  Fluoroscopy Time:  1 minutes and 4 seconds Number of Acquired Spot Images: 2 COMPARISON:  10/04/2017.  CT, 03/05/2019. FINDINGS: Submitted images show pedicle screws at L1, L2 and L3, which appear well seated and positioned. Intervertebral cages are well centered at the L2-L3 level.  Posterior fusion hardware extending inferiorly from L3 is partly imaged stable from the prior CT. IMPRESSION: Portable operative imaging performed for L1 through L3 posterior lumbar spine fusion. Electronically Signed   By: Lajean Manes M.D.   On: 06/13/2019 15:55    Assessment/Plan: Postop day 2 L1 L3 fusion making progress but still significant amount of back pain.  We will put her on some steroids mobilize her today with therapy probable discharge in the morning  LOS: 2 days     Revel Stellmach P 06/15/2019, 10:01 AM

## 2019-06-15 NOTE — Progress Notes (Signed)
Physical Therapy Treatment Patient Details Name: Angel Wall MRN: [id number] DOB: Jun 12, 1951 Today's Date: 06/15/2019    History of Present Illness Pt is a 68 y/o female who presents s/p L1-L3 PLIF on 06/13/2019. PMH significant for  HTN, hepatitis C, DM, spinal fusion 1975 &1992, ORIF tibia/fibula fractures.     PT Comments    Pt progressing well with post-op mobility. She was able to demonstrate transfers and ambulation with gross min guard assist to supervision for safety. Pt continues to appear unsteady with ambulation but declines SPC use. Reinforced education on precautions, brace application/wearing schedule, appropriate activity progression, and car transfer. Will continue to follow.     Follow Up Recommendations  No PT follow up;Supervision for mobility/OOB     Equipment Recommendations  None recommended by PT    Recommendations for Other Services       Precautions / Restrictions Precautions Precautions: Back;Fall Precaution Booklet Issued: No Precaution Comments: Reviewed precautions verbally during functional mobility, ADL and IADL.  Required Braces or Orthoses: Spinal Brace Spinal Brace: Lumbar corset;Applied in sitting position Restrictions Weight Bearing Restrictions: No    Mobility  Bed Mobility Overal bed mobility: Needs Assistance Bed Mobility: Sit to Sidelying;Rolling Rolling: Modified independent (Device/Increase time) Sidelying to sit: Supervision     Sit to sidelying: Supervision General bed mobility comments: Pt able to transition to/from EOB with HOB flat and rails lowered to simulate home environment. With proper log roll technique, pt did not require assistance and was able to complete with supervision for safety.   Transfers Overall transfer level: Needs assistance Equipment used: None Transfers: Sit to/from Stand Sit to Stand: Supervision         General transfer comment: slow to stand and steady, but no physical assist  needed  Ambulation/Gait Ambulation/Gait assistance: Min guard;Supervision Gait Distance (Feet): 200 Feet Assistive device: None Gait Pattern/deviations: Step-through pattern;Decreased stride length;Trunk flexed Gait velocity: Decreased   General Gait Details: Unsteady with frequent reaching out for railings in hall. Pt again declines SPC but feel this would be beneficial for home. Pt reports fatigue during gait training and several short standing rest breaks taken.    Stairs             Wheelchair Mobility    Modified Rankin (Stroke Patients Only)       Balance Overall balance assessment: Needs assistance Sitting-balance support: Feet supported;No upper extremity supported Sitting balance-Leahy Scale: Good Sitting balance - Comments: no LOB donning socks   Standing balance support: No upper extremity supported;During functional activity Standing balance-Leahy Scale: Poor Standing balance comment: Fair to poor. Static activity pt does not require UE support however with dynamic activity/walking, requires intermittent UE support.                             Cognition Arousal/Alertness: Awake/alert Behavior During Therapy: WFL for tasks assessed/performed Overall Cognitive Status: Within Functional Limits for tasks assessed                                        Exercises      General Comments        Pertinent Vitals/Pain Pain Assessment: Faces Faces Pain Scale: Hurts even more Pain Location: Incision site Pain Descriptors / Indicators: Operative site guarding;Grimacing Pain Intervention(s): Limited activity within patient's tolerance;Monitored during session;Repositioned    Home Living  Prior Function            PT Goals (current goals can now be found in the care plan section) Acute Rehab PT Goals Patient Stated Goal: Pain control and return home today.  PT Goal Formulation: With patient Time  For Goal Achievement: 06/21/19 Potential to Achieve Goals: Good Progress towards PT goals: Progressing toward goals    Frequency    Min 5X/week      PT Plan Current plan remains appropriate    Co-evaluation              AM-PAC PT "6 Clicks" Mobility   Outcome Measure  Help needed turning from your back to your side while in a flat bed without using bedrails?: None Help needed moving from lying on your back to sitting on the side of a flat bed without using bedrails?: A Little Help needed moving to and from a bed to a chair (including a wheelchair)?: A Little Help needed standing up from a chair using your arms (e.g., wheelchair or bedside chair)?: A Little Help needed to walk in hospital room?: A Little Help needed climbing 3-5 steps with a railing? : A Little 6 Click Score: 19    End of Session Equipment Utilized During Treatment: Gait belt;Back brace Activity Tolerance: Patient tolerated treatment well Patient left: in chair;with call bell/phone within reach Nurse Communication: Mobility status PT Visit Diagnosis: Unsteadiness on feet (R26.81);Pain Pain - part of body: (back)     Time: [id number] PT Time Calculation (min) (ACUTE ONLY): 19 min  Charges:  $Gait Training: 8-22 mins                     Angel Wall, PT, DPT Acute Rehabilitation Services Pager: [phone] Office: [phone]    Angel Wall 06/15/2019, 2:45 PM

## 2019-06-16 LAB — GLUCOSE, CAPILLARY: Glucose-Capillary: 171 mg/dL — ABNORMAL HIGH (ref 70–99)

## 2019-06-16 NOTE — Discharge Instructions (Signed)

## 2019-06-16 NOTE — Plan of Care (Signed)
Patient alert and oriented, mae's well, voiding adequate amount of urine, swallowing without difficulty, no c/o pain at time of discharge. Patient discharged home with family. Script and discharged instructions given to patient. Patient and family stated understanding of instructions given. Patient has an appointment with Dr. Cram 

## 2019-06-16 NOTE — Progress Notes (Signed)
Physical Therapy Treatment Patient Details Name: Angel Wall MRN: [id number] DOB: Feb 26, 1951 Today's Date: 06/16/2019    History of Present Illness Pt is a 68 y/o female who presents s/p L1-L3 PLIF on 06/13/2019. PMH significant for  HTN, hepatitis C, DM, spinal fusion 1975 &1992, ORIF tibia/fibula fractures.     PT Comments    Pt making progress with mobility. She continues to demonstrate an unsteady gait pattern without use of an AD; however, frequently reaching for support surfaces during ambulation. Pt participated in stair training this session as well with min guard and cueing for safety. PT provided pt education re: car transfers, generalized walking program and home safety/set-up. PT will continue to follow pt acutely as per PT POC.    Follow Up Recommendations  No PT follow up;Supervision for mobility/OOB     Equipment Recommendations  None recommended by PT    Recommendations for Other Services       Precautions / Restrictions Precautions Precautions: Back;Fall Required Braces or Orthoses: Spinal Brace Spinal Brace: Lumbar corset;Applied in sitting position Restrictions Weight Bearing Restrictions: No    Mobility  Bed Mobility               General bed mobility comments: pt OOB in recliner chair upon arrival  Transfers Overall transfer level: Needs assistance Equipment used: None Transfers: Sit to/from Stand Sit to Stand: Supervision         General transfer comment: supervision for safety, no instability noted  Ambulation/Gait Ambulation/Gait assistance: Min guard;Supervision Gait Distance (Feet): 250 Feet Assistive device: None Gait Pattern/deviations: Step-through pattern;Decreased stride length;Trunk flexed Gait velocity: Decreased   General Gait Details: pt with mild instability and frequently reaching out for support surfaces in hallway; no overt LOB or need for physical assistance, min guard to supervision level for safety  throughout   Stairs Stairs: Yes Stairs assistance: Min guard Stair Management: One rail Left;Step to pattern;Forwards Number of Stairs: 6 General stair comments: min guard for safety, pt using one hand rail; discussed ascending/descending laterally if pt felt she needed 2UE supports but pt declining   Wheelchair Mobility    Modified Rankin (Stroke Patients Only)       Balance Overall balance assessment: Needs assistance Sitting-balance support: Feet supported;No upper extremity supported Sitting balance-Leahy Scale: Good     Standing balance support: No upper extremity supported;During functional activity Standing balance-Leahy Scale: Poor Standing balance comment: static standing is fair, dynamic is poor                             Cognition Arousal/Alertness: Awake/alert Behavior During Therapy: WFL for tasks assessed/performed Overall Cognitive Status: Within Functional Limits for tasks assessed                                        Exercises      General Comments        Pertinent Vitals/Pain Pain Assessment: 0-10 Pain Score: 8  Pain Location: Incision site Pain Descriptors / Indicators: Operative site guarding;Grimacing Pain Intervention(s): Monitored during session;Repositioned;Premedicated before session    Home Living                      Prior Function            PT Goals (current goals can now be found in the care plan section) Acute  Rehab PT Goals PT Goal Formulation: With patient Time For Goal Achievement: 06/21/19 Potential to Achieve Goals: Good Progress towards PT goals: Progressing toward goals    Frequency    Min 5X/week      PT Plan Current plan remains appropriate    Co-evaluation              AM-PAC PT "6 Clicks" Mobility   Outcome Measure  Help needed turning from your back to your side while in a flat bed without using bedrails?: None Help needed moving from lying on your  back to sitting on the side of a flat bed without using bedrails?: None Help needed moving to and from a bed to a chair (including a wheelchair)?: None Help needed standing up from a chair using your arms (e.g., wheelchair or bedside chair)?: None Help needed to walk in hospital room?: A Little Help needed climbing 3-5 steps with a railing? : A Little 6 Click Score: 22    End of Session Equipment Utilized During Treatment: Back brace Activity Tolerance: Patient tolerated treatment well Patient left: with call bell/phone within reach Nurse Communication: Mobility status PT Visit Diagnosis: Unsteadiness on feet (R26.81);Pain Pain - part of body: (back)     Time: [id number] PT Time Calculation (min) (ACUTE ONLY): 12 min  Charges:  $Gait Training: 8-22 mins                     Sherie Don, [place], DPT  Acute Rehabilitation Services Pager [phone] Office [place] 06/16/2019, 8:50 AM

## 2019-06-16 NOTE — Discharge Summary (Signed)
Physician Discharge Summary  Patient ID: Angel Wall MRN: [id number] DOB/AGE: September 24, 1950 68 y.o. Estimated body mass index is 29.64 kg/m as calculated from the following:   Height as of this encounter: 5\' 4"  (1.626 m).   Weight as of this encounter: 78.3 kg.   Admit date: 06/13/2019 Discharge date: 06/16/2019  Admission Diagnoses: Lumbar lumbar spondylosis and stenosis  Discharge Diagnoses: Same Active Problems:   Status post lumbar spinal fusion   Discharged Condition: good  Hospital Course: Patient was admitted to hospital underwent decompressive laminectomy and fusion L1-L3 postoperatively patient did very well recovering the floor on the floor was ambulating and voiding spontaneously tolerating regular diet stable for discharge home.  Consults: Significant Diagnostic Studies: Treatments: L1-L3 lumbar decompression fusion Discharge Exam: Blood pressure 108/62, pulse 91, temperature 97.9 F (36.6 C), temperature source Oral, resp. rate 18, height 5\' 4"  (1.626 m), weight 78.3 kg, SpO2 96 %. Strength 5 out of 5 wound clean dry and intact  Disposition: Home  Discharge Instructions    Incentive spirometry RT   Complete by: As directed      Allergies as of 06/16/2019      Reactions   Aspirin Other (See Comments)   History of GI bleed      Medication List    TAKE these medications   atenolol-chlorthalidone 100-25 MG tablet Commonly known as: TENORETIC Take 1 tablet by mouth daily.   celecoxib 200 MG capsule Commonly known as: CELEBREX Take 200 mg by mouth daily.   cyclobenzaprine 10 MG tablet Commonly known as: FLEXERIL Take 10 mg by mouth 3 (three) times daily as needed for muscle spasms.   gabapentin 600 MG tablet Commonly known as: NEURONTIN Take 600 mg by mouth 3 (three) times daily as needed (nerve pain).   hydroxypropyl methylcellulose / hypromellose 2.5 % ophthalmic solution Commonly known as: ISOPTO TEARS / GONIOVISC Place 1 drop into both  eyes daily as needed for dry eyes.   Lancets Misc Check blood glucose twice daily / DX: E11.65   metFORMIN 500 MG 24 hr tablet Commonly known as: GLUCOPHAGE-XR Take 1,000 mg by mouth 2 (two) times daily.   pantoprazole 40 MG tablet Commonly known as: PROTONIX Take 40 mg by mouth daily.   pioglitazone 30 MG tablet Commonly known as: ACTOS Take 30 mg by mouth daily.   simvastatin 10 MG tablet Commonly known as: ZOCOR Take 10 mg by mouth every evening.        Signed: Shia Delaine P 06/16/2019, 7:10 AM

## 2019-06-18 LAB — TYPE AND SCREEN
ABO/RH(D): O POS
Antibody Screen: POSITIVE
Donor AG Type: NEGATIVE
Donor AG Type: NEGATIVE
PT AG Type: NEGATIVE
Unit division: 0
Unit division: 0

## 2019-06-18 LAB — BPAM RBC
Blood Product Expiration Date: 202011112359
Blood Product Expiration Date: 202011122359
Unit Type and Rh: 5100
Unit Type and Rh: 5100

## 2019-12-20 ENCOUNTER — Other Ambulatory Visit: Payer: Self-pay | Admitting: Neurosurgery

## 2019-12-20 DIAGNOSIS — M48062 Spinal stenosis, lumbar region with neurogenic claudication: Secondary | ICD-10-CM

## 2020-01-11 ENCOUNTER — Ambulatory Visit
Admission: RE | Admit: 2020-01-11 | Discharge: 2020-01-11 | Disposition: A | Discharge status: Home / Self Care | Payer: Medicare Other | Source: Ambulatory Visit | Attending: Neurosurgery | Admitting: Neurosurgery

## 2020-01-11 DIAGNOSIS — M48062 Spinal stenosis, lumbar region with neurogenic claudication: Secondary | ICD-10-CM

## 2020-11-30 IMAGING — CT CT L SPINE W/O CM
1 of 7 series · 5 of 14 positions shown, 7 images · non-contrast
Comparison: CT myelogram 03/05/2019

CLINICAL DATA: Lumbar spinal stenosis with neurogenic claudication

EXAM:
CT LUMBAR SPINE WITHOUT CONTRAST
TECHNIQUE: Multidetector CT imaging of the lumbar spine was performed without
intravenous contrast administration. Multiplanar CT image
reconstructions were also generated.

[Series 2: l spine soft · axial · 0.27mm/px · z∈[+592,+751]mm · 5 of 79 slices shown, 7 images]
[im 14/79  soft-tissue]
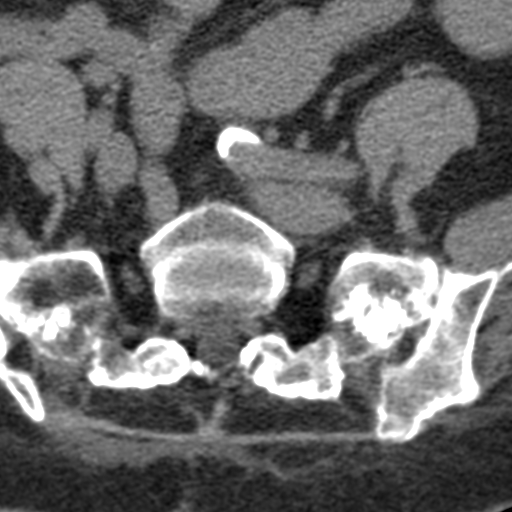
[im 14/79  bone]
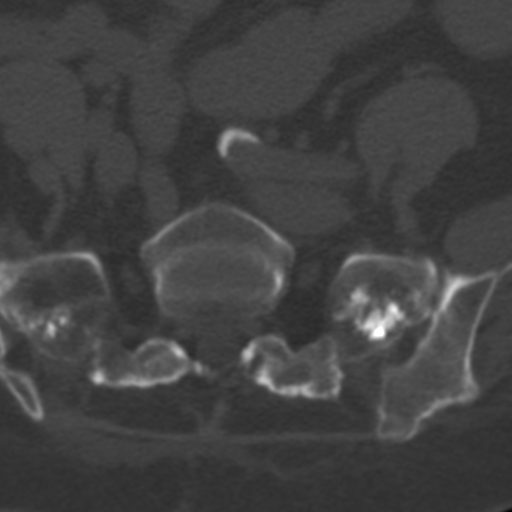
[im 27/79  bone]
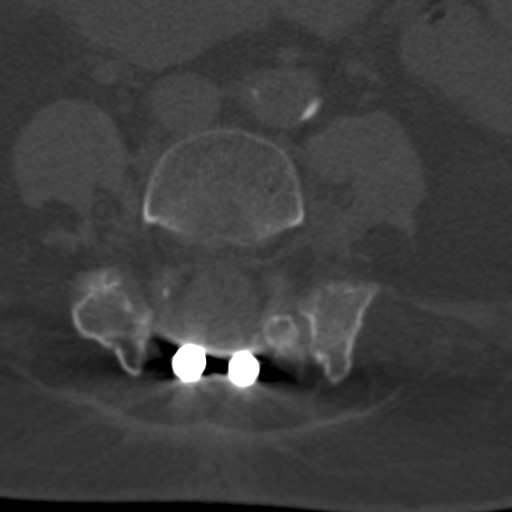
[im 40/79  bone]
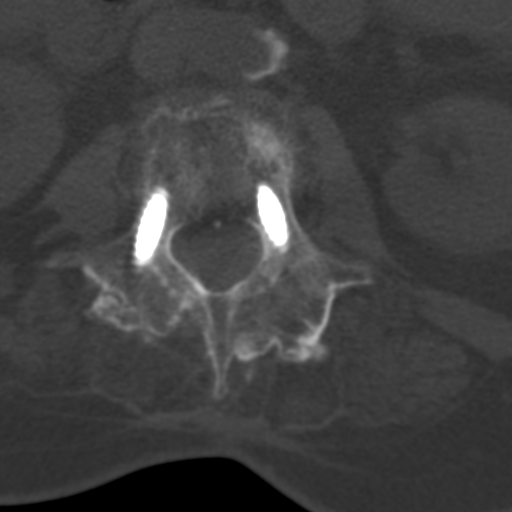
[im 53/79  bone]
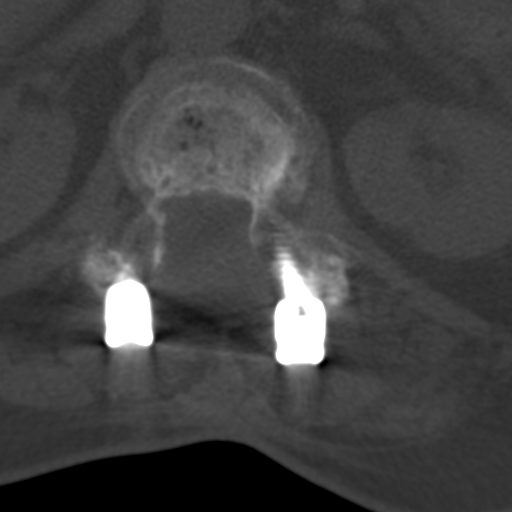
[im 66/79  soft-tissue]
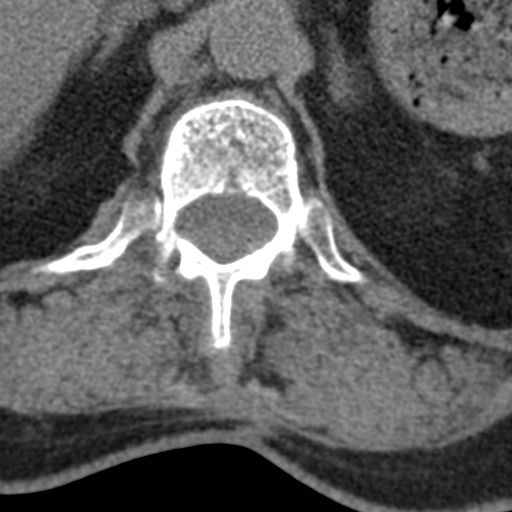
[im 66/79  bone]
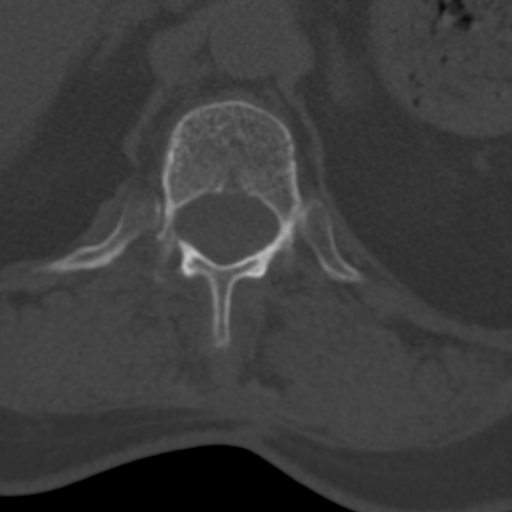

[5 of 14 positions shown; findings below may reference images not displayed]

FINDINGS: Segmentation: Normal segmentation. L5-S1 is a hypoplastic disc
space. L5 is a transitional vertebra.

Alignment: 4 mm anterolisthesis L4-5 unchanged. 4 mm anterolisthesis
L5-S1 unchanged.

Vertebrae: Negative for acute fracture or mass. Mild compression
fracture T12 is unchanged.

Sacral plasty bilaterally with cement in the sacrum in good position
bilaterally.

Paraspinal and other soft tissues: Mild atherosclerotic
calcification in the aorta. No paraspinous mass.

Disc levels: T11-12: Disc degeneration and spurring. Mild facet
degeneration on the right. No significant stenosis

T12-L1: Advanced disc degeneration with disc space narrowing and
spurring. Gas in the disc space. Disc degeneration is unchanged. No
significant stenosis

L1-2: Interval pedicle screw fusion bilaterally without interbody
fusion. Posterior decompression. There is advanced disc degeneration
and spurring. No significant stenosis.

L2-3: Interval pedicle screw and interbody fusion. Solid interbody
fusion. Posterior decompression. Negative for stenosis. Hardware in
good position.

L3-4: Solid posterior bony fusion. Disc degeneration and mild
central spurring. No significant stenosis

L4-5: 4 mm anterolisthesis. Disc degeneration with posterior annular
fiber calcification and gas in the disc space. Solid posterior bony
fusion with decompression. Posterior rod and hook fusion L3 through
S1. The lower hook on the right projects into the subarticular space
adjacent to the right S1 nerve root. This is unchanged. Negative for
spinal or foraminal stenosis

L5-S1: Hypoplastic disc space.  Negative for stenosis.
IMPRESSION: 1. Since the prior myelogram, there has been interval fusion at L1-2
and L2-3. Solid interbody fusion at L2 2 3. No interbody fusion at
L1-2.
2. Posterior solid bony fusion at L3-4 L4-5 and L5-S1. Posterior rod
and posterior fusion at L3-S1 unchanged. The right lower hook
projects into the subarticular recess and could be causing
impingement of the right S1 nerve root. No change from the prior
study.
3. Prior sacroplasty bilaterally.
